# Patient Record
Sex: Female | Born: 1975 | Race: Black or African American | Hispanic: No | Marital: Married | Smoking: Never smoker
Health system: Southern US, Community
[De-identification: ages and names within clinical notes are randomized; demographics above are authoritative.]

## PROBLEM LIST (undated history)

## (undated) DIAGNOSIS — D649 Anemia, unspecified: Secondary | ICD-10-CM

## (undated) DIAGNOSIS — D563 Thalassemia minor: Secondary | ICD-10-CM

## (undated) HISTORY — PX: COLONOSCOPY: SHX174

## (undated) HISTORY — PX: OTHER SURGICAL HISTORY: SHX169

## (undated) HISTORY — DX: Thalassemia minor: D56.3

## (undated) HISTORY — PX: BREAST BIOPSY: SHX20

## (undated) HISTORY — PX: DILATION AND CURETTAGE OF UTERUS: SHX78

## (undated) HISTORY — DX: Anemia, unspecified: D64.9

---

## 2014-10-07 LAB — OB RESULTS CONSOLE GC/CHLAMYDIA
Chlamydia: NEGATIVE
Gonorrhea: NEGATIVE

## 2015-02-26 NOTE — L&D Delivery Note (Signed)
Delivery Note At 2:15 PM a viable female was delivered via Vaginal, Spontaneous Delivery   Patient complete and pushing over intact perineum. Nuchal cord x 1. Infant delivered to mom's abdomen. Delayed cord clamping x 1 minute. Cord clamped x 2, cut. Spontaneous cry heard. APGAR: 8, 9 Weight pending. Cord blood obtained. Placenta delivered spontaneously and intact. LUS cleared of clot Vagina inspected.2nd degree lacerations noted. Repaired with 4-0 Vicryl Rapide EBL: 300cc Anesthesia: epidural, local  Mom to postpartum.  Baby to Couplet care / Skin to Skin.  Christine Turner 11/07/2015, 2:37 PM

## 2015-03-29 LAB — OB RESULTS CONSOLE HIV ANTIBODY (ROUTINE TESTING): HIV: NONREACTIVE

## 2015-03-29 LAB — OB RESULTS CONSOLE ABO/RH: RH Type: POSITIVE

## 2015-03-29 LAB — OB RESULTS CONSOLE RPR: RPR: NONREACTIVE

## 2015-03-29 LAB — OB RESULTS CONSOLE HEPATITIS B SURFACE ANTIGEN: Hepatitis B Surface Ag: NEGATIVE

## 2015-03-29 LAB — OB RESULTS CONSOLE ANTIBODY SCREEN: Antibody Screen: NEGATIVE

## 2015-03-29 LAB — OB RESULTS CONSOLE RUBELLA ANTIBODY, IGM: Rubella: IMMUNE

## 2015-07-21 ENCOUNTER — Encounter: Payer: Self-pay | Admitting: *Deleted

## 2015-07-25 ENCOUNTER — Ambulatory Visit (INDEPENDENT_AMBULATORY_CARE_PROVIDER_SITE_OTHER): Payer: BLUE CROSS/BLUE SHIELD | Admitting: Family Medicine

## 2015-07-25 ENCOUNTER — Encounter: Payer: Self-pay | Admitting: Family Medicine

## 2015-07-25 VITALS — BP 103/67 | HR 101 | Ht 64.0 in | Wt 137.0 lb

## 2015-07-25 DIAGNOSIS — O0990 Supervision of high risk pregnancy, unspecified, unspecified trimester: Secondary | ICD-10-CM | POA: Insufficient documentation

## 2015-07-25 DIAGNOSIS — O34219 Maternal care for unspecified type scar from previous cesarean delivery: Secondary | ICD-10-CM

## 2015-07-25 DIAGNOSIS — Z36 Encounter for antenatal screening of mother: Secondary | ICD-10-CM

## 2015-07-25 DIAGNOSIS — O0992 Supervision of high risk pregnancy, unspecified, second trimester: Secondary | ICD-10-CM

## 2015-07-25 DIAGNOSIS — O09522 Supervision of elderly multigravida, second trimester: Secondary | ICD-10-CM

## 2015-07-25 DIAGNOSIS — L0232 Furuncle of buttock: Secondary | ICD-10-CM

## 2015-07-25 DIAGNOSIS — O09529 Supervision of elderly multigravida, unspecified trimester: Secondary | ICD-10-CM | POA: Insufficient documentation

## 2015-07-25 DIAGNOSIS — D561 Beta thalassemia: Secondary | ICD-10-CM | POA: Insufficient documentation

## 2015-07-25 DIAGNOSIS — N83202 Unspecified ovarian cyst, left side: Secondary | ICD-10-CM

## 2015-07-25 MED ORDER — SULFAMETHOXAZOLE-TRIMETHOPRIM 800-160 MG PO TABS: 1.0000 | ORAL_TABLET | Freq: Two times a day (BID) | ORAL | Status: DC

## 2015-07-25 NOTE — Patient Instructions (Signed)
Second Trimester of Pregnancy The second trimester is from week 13 through week 28, months 4 through 6. The second trimester is often a time when you feel your best. Your body has also adjusted to being pregnant, and you begin to feel better physically. Usually, morning sickness has lessened or quit completely, you may have more energy, and you may have an increase in appetite. The second trimester is also a time when the fetus is growing rapidly. At the end of the sixth month, the fetus is about 9 inches long and weighs about 1 pounds. You will likely begin to feel the baby move (quickening) between 18 and 20 weeks of the pregnancy. BODY CHANGES Your body goes through many changes during pregnancy. The changes vary from woman to woman.   Your weight will continue to increase. You will notice your lower abdomen bulging out.  You may begin to get stretch marks on your hips, abdomen, and breasts.  You may develop headaches that can be relieved by medicines approved by your health care provider.  You may urinate more often because the fetus is pressing on your bladder.  You may develop or continue to have heartburn as a result of your pregnancy.  You may develop constipation because certain hormones are causing the muscles that push waste through your intestines to slow down.  You may develop hemorrhoids or swollen, bulging veins (varicose veins).  You may have back pain because of the weight gain and pregnancy hormones relaxing your joints between the bones in your pelvis and as a result of a shift in weight and the muscles that support your balance.  Your breasts will continue to grow and be tender.  Your gums may bleed and may be sensitive to brushing and flossing.  Dark spots or blotches (chloasma, mask of pregnancy) may develop on your face. This will likely fade after the baby is born.  A dark line from your belly button to the pubic area (linea nigra) may appear. This will likely  fade after the baby is born.  You may have changes in your hair. These can include thickening of your hair, rapid growth, and changes in texture. Some women also have hair loss during or after pregnancy, or hair that feels dry or thin. Your hair will most likely return to normal after your baby is born. WHAT TO EXPECT AT YOUR PRENATAL VISITS During a routine prenatal visit:  You will be weighed to make sure you and the fetus are growing normally.  Your blood pressure will be taken.  Your abdomen will be measured to track your baby's growth.  The fetal heartbeat will be listened to.  Any test results from the previous visit will be discussed. Your health care provider may ask you:  How you are feeling.  If you are feeling the baby move.  If you have had any abnormal symptoms, such as leaking fluid, bleeding, severe headaches, or abdominal cramping.  If you are using any tobacco products, including cigarettes, chewing tobacco, and electronic cigarettes.  If you have any questions. Other tests that may be performed during your second trimester include:  Blood tests that check for:  Low iron levels (anemia).  Gestational diabetes (between 24 and 28 weeks).  Rh antibodies.  Urine tests to check for infections, diabetes, or protein in the urine.  An ultrasound to confirm the proper growth and development of the baby.  An amniocentesis to check for possible genetic problems.  Fetal screens for spina bifida   and Down syndrome.  HIV (human immunodeficiency virus) testing. Routine prenatal testing includes screening for HIV, unless you choose not to have this test. HOME CARE INSTRUCTIONS   Avoid all smoking, herbs, alcohol, and unprescribed drugs. These chemicals affect the formation and growth of the baby.  Do not use any tobacco products, including cigarettes, chewing tobacco, and electronic cigarettes. If you need help quitting, ask your health care provider. You may receive  counseling support and other resources to help you quit.  Follow your health care provider's instructions regarding medicine use. There are medicines that are either safe or unsafe to take during pregnancy.  Exercise only as directed by your health care provider. Experiencing uterine cramps is a good sign to stop exercising.  Continue to eat regular, healthy meals.  Wear a good support bra for breast tenderness.  Do not use hot tubs, steam rooms, or saunas.  Wear your seat belt at all times when driving.  Avoid raw meat, uncooked cheese, cat litter boxes, and soil used by cats. These carry germs that can cause birth defects in the baby.  Take your prenatal vitamins.  Take 1500-2000 mg of calcium daily starting at the 20th week of pregnancy until you deliver your baby.  Try taking a stool softener (if your health care provider approves) if you develop constipation. Eat more high-fiber foods, such as fresh vegetables or fruit and whole grains. Drink plenty of fluids to keep your urine clear or pale yellow.  Take warm sitz baths to soothe any pain or discomfort caused by hemorrhoids. Use hemorrhoid cream if your health care provider approves.  If you develop varicose veins, wear support hose. Elevate your feet for 15 minutes, 3-4 times a day. Limit salt in your diet.  Avoid heavy lifting, wear low heel shoes, and practice good posture.  Rest with your legs elevated if you have leg cramps or low back pain.  Visit your dentist if you have not gone yet during your pregnancy. Use a soft toothbrush to brush your teeth and be gentle when you floss.  A sexual relationship may be continued unless your health care provider directs you otherwise.  Continue to go to all your prenatal visits as directed by your health care provider. SEEK MEDICAL CARE IF:   You have dizziness.  You have mild pelvic cramps, pelvic pressure, or nagging pain in the abdominal area.  You have persistent nausea,  vomiting, or diarrhea.  You have a bad smelling vaginal discharge.  You have pain with urination. SEEK IMMEDIATE MEDICAL CARE IF:   You have a fever.  You are leaking fluid from your vagina.  You have spotting or bleeding from your vagina.  You have severe abdominal cramping or pain.  You have rapid weight gain or loss.  You have shortness of breath with chest pain.  You notice sudden or extreme swelling of your face, hands, ankles, feet, or legs.  You have not felt your baby move in over an hour.  You have severe headaches that do not go away with medicine.  You have vision changes.   This information is not intended to replace advice given to you by your health care provider. Make sure you discuss any questions you have with your health care provider.   Document Released: 02/05/2001 Document Revised: 03/04/2014 Document Reviewed: 04/14/2012 Elsevier Interactive Patient Education 2016 [employer]   Breastfeeding Deciding to breastfeed is one of the best choices you can make for you and your baby. A change   in hormones during pregnancy causes your breast tissue to grow and increases the number and size of your milk ducts. These hormones also allow proteins, sugars, and fats from your blood supply to make breast milk in your milk-producing glands. Hormones prevent breast milk from being released before your baby is born as well as prompt milk flow after birth. Once breastfeeding has begun, thoughts of your baby, as well as his or her sucking or crying, can stimulate the release of milk from your milk-producing glands.  BENEFITS OF BREASTFEEDING For Your Baby  Your first milk (colostrum) helps your baby's digestive system function better.  There are antibodies in your milk that help your baby fight off infections.  Your baby has a lower incidence of asthma, allergies, and sudden infant death syndrome.  The nutrients in breast milk are better for your baby than infant  formulas and are designed uniquely for your baby's needs.  Breast milk improves your baby's brain development.  Your baby is less likely to develop other conditions, such as childhood obesity, asthma, or type 2 diabetes mellitus. For You  Breastfeeding helps to create a very special bond between you and your baby.  Breastfeeding is convenient. Breast milk is always available at the correct temperature and costs nothing.  Breastfeeding helps to burn calories and helps you lose the weight gained during pregnancy.  Breastfeeding makes your uterus contract to its prepregnancy size faster and slows bleeding (lochia) after you give birth.   Breastfeeding helps to lower your risk of developing type 2 diabetes mellitus, osteoporosis, and breast or ovarian cancer later in life. SIGNS THAT YOUR BABY IS HUNGRY Early Signs of Hunger  Increased alertness or activity.  Stretching.  Movement of the head from side to side.  Movement of the head and opening of the mouth when the corner of the mouth or cheek is stroked (rooting).  Increased sucking sounds, smacking lips, cooing, sighing, or squeaking.  Hand-to-mouth movements.  Increased sucking of fingers or hands. Late Signs of Hunger  Fussing.  Intermittent crying. Extreme Signs of Hunger Signs of extreme hunger will require calming and consoling before your baby will be able to breastfeed successfully. Do not wait for the following signs of extreme hunger to occur before you initiate breastfeeding:  Restlessness.  A loud, strong cry.  Screaming. BREASTFEEDING BASICS Breastfeeding Initiation  Find a comfortable place to sit or lie down, with your neck and back well supported.  Place a pillow or rolled up blanket under your baby to bring him or her to the level of your breast (if you are seated). Nursing pillows are specially designed to help support your arms and your baby while you breastfeed.  Make sure that your baby's  abdomen is facing your abdomen.  Gently massage your breast. With your fingertips, massage from your chest wall toward your nipple in a circular motion. This encourages milk flow. You may need to continue this action during the feeding if your milk flows slowly.  Support your breast with 4 fingers underneath and your thumb above your nipple. Make sure your fingers are well away from your nipple and your baby's mouth.  Stroke your baby's lips gently with your finger or nipple.  When your baby's mouth is open wide enough, quickly bring your baby to your breast, placing your entire nipple and as much of the colored area around your nipple (areola) as possible into your baby's mouth.  More areola should be visible above your baby's upper lip than   below the lower lip.  Your baby's tongue should be between his or her lower gum and your breast.  Ensure that your baby's mouth is correctly positioned around your nipple (latched). Your baby's lips should create a seal on your breast and be turned out (everted).  It is common for your baby to suck about 2-3 minutes in order to start the flow of breast milk. Latching Teaching your baby how to latch on to your breast properly is very important. An improper latch can cause nipple pain and decreased milk supply for you and poor weight gain in your baby. Also, if your baby is not latched onto your nipple properly, he or she may swallow some air during feeding. This can make your baby fussy. Burping your baby when you switch breasts during the feeding can help to get rid of the air. However, teaching your baby to latch on properly is still the best way to prevent fussiness from swallowing air while breastfeeding. Signs that your baby has successfully latched on to your nipple:  Silent tugging or silent sucking, without causing you pain.  Swallowing heard between every 3-4 sucks.  Muscle movement above and in front of his or her ears while sucking. Signs  that your baby has not successfully latched on to nipple:  Sucking sounds or smacking sounds from your baby while breastfeeding.  Nipple pain. If you think your baby has not latched on correctly, slip your finger into the corner of your baby's mouth to break the suction and place it between your baby's gums. Attempt breastfeeding initiation again. Signs of Successful Breastfeeding Signs from your baby:  A gradual decrease in the number of sucks or complete cessation of sucking.  Falling asleep.  Relaxation of his or her body.  Retention of a small amount of milk in his or her mouth.  Letting go of your breast by himself or herself. Signs from you:  Breasts that have increased in firmness, weight, and size 1-3 hours after feeding.  Breasts that are softer immediately after breastfeeding.  Increased milk volume, as well as a change in milk consistency and color by the fifth day of breastfeeding.  Nipples that are not sore, cracked, or bleeding. Signs That Your Baby is Getting Enough Milk  Wetting at least 3 diapers in a 24-hour period. The urine should be clear and pale yellow by age 5 days.  At least 3 stools in a 24-hour period by age 5 days. The stool should be soft and yellow.  At least 3 stools in a 24-hour period by age 7 days. The stool should be seedy and yellow.  No loss of weight greater than 10% of birth weight during the first 3 days of age.  Average weight gain of 4-7 ounces (113-198 g) per week after age 4 days.  Consistent daily weight gain by age 5 days, without weight loss after the age of 2 weeks. After a feeding, your baby may spit up a small amount. This is common. BREASTFEEDING FREQUENCY AND DURATION Frequent feeding will help you make more milk and can prevent sore nipples and breast engorgement. Breastfeed when you feel the need to reduce the fullness of your breasts or when your baby shows signs of hunger. This is called "breastfeeding on demand." Avoid  introducing a pacifier to your baby while you are working to establish breastfeeding (the first 4-6 weeks after your baby is born). After this time you may choose to use a pacifier. Research has shown that   pacifier use during the first year of a baby's life decreases the risk of sudden infant death syndrome (SIDS). Allow your baby to feed on each breast as long as he or she wants. Breastfeed until your baby is finished feeding. When your baby unlatches or falls asleep while feeding from the first breast, offer the second breast. Because newborns are often sleepy in the first few weeks of life, you may need to awaken your baby to get him or her to feed. Breastfeeding times will vary from baby to baby. However, the following rules can serve as a guide to help you ensure that your baby is properly fed:  Newborns (babies 4 weeks of age or younger) may breastfeed every 1-3 hours.  Newborns should not go longer than 3 hours during the day or 5 hours during the night without breastfeeding.  You should breastfeed your baby a minimum of 8 times in a 24-hour period until you begin to introduce solid foods to your baby at around 6 months of age. BREAST MILK PUMPING Pumping and storing breast milk allows you to ensure that your baby is exclusively fed your breast milk, even at times when you are unable to breastfeed. This is especially important if you are going back to work while you are still breastfeeding or when you are not able to be present during feedings. Your lactation consultant can give you guidelines on how long it is safe to store breast milk. A breast pump is a machine that allows you to pump milk from your breast into a sterile bottle. The pumped breast milk can then be stored in a refrigerator or freezer. Some breast pumps are operated by hand, while others use electricity. Ask your lactation consultant which type will work best for you. Breast pumps can be purchased, but some hospitals and  breastfeeding support groups lease breast pumps on a monthly basis. A lactation consultant can teach you how to hand express breast milk, if you prefer not to use a pump. CARING FOR YOUR BREASTS WHILE YOU BREASTFEED Nipples can become dry, cracked, and sore while breastfeeding. The following recommendations can help keep your breasts moisturized and healthy:  Avoid using soap on your nipples.  Wear a supportive bra. Although not required, special nursing bras and tank tops are designed to allow access to your breasts for breastfeeding without taking off your entire bra or top. Avoid wearing underwire-style bras or extremely tight bras.  Air dry your nipples for 3-4minutes after each feeding.  Use only cotton bra pads to absorb leaked breast milk. Leaking of breast milk between feedings is normal.  Use lanolin on your nipples after breastfeeding. Lanolin helps to maintain your skin's normal moisture barrier. If you use pure lanolin, you do not need to wash it off before feeding your baby again. Pure lanolin is not toxic to your baby. You may also hand express a few drops of breast milk and gently massage that milk into your nipples and allow the milk to air dry. In the first few weeks after giving birth, some women experience extremely full breasts (engorgement). Engorgement can make your breasts feel heavy, warm, and tender to the touch. Engorgement peaks within 3-5 days after you give birth. The following recommendations can help ease engorgement:  Completely empty your breasts while breastfeeding or pumping. You may want to start by applying warm, moist heat (in the shower or with warm water-soaked hand towels) just before feeding or pumping. This increases circulation and helps the milk   flow. If your baby does not completely empty your breasts while breastfeeding, pump any extra milk after he or she is finished.  Wear a snug bra (nursing or regular) or tank top for 1-2 days to signal your body  to slightly decrease milk production.  Apply ice packs to your breasts, unless this is too uncomfortable for you.  Make sure that your baby is latched on and positioned properly while breastfeeding. If engorgement persists after 48 hours of following these recommendations, contact your health care provider or a lactation consultant. OVERALL HEALTH CARE RECOMMENDATIONS WHILE BREASTFEEDING  Eat healthy foods. Alternate between meals and snacks, eating 3 of each per day. Because what you eat affects your breast milk, some of the foods may make your baby more irritable than usual. Avoid eating these foods if you are sure that they are negatively affecting your baby.  Drink milk, fruit juice, and water to satisfy your thirst (about 10 glasses a day).  Rest often, relax, and continue to take your prenatal vitamins to prevent fatigue, stress, and anemia.  Continue breast self-awareness checks.  Avoid chewing and smoking tobacco. Chemicals from cigarettes that pass into breast milk and exposure to secondhand smoke may harm your baby.  Avoid alcohol and drug use, including marijuana. Some medicines that may be harmful to your baby can pass through breast milk. It is important to ask your health care provider before taking any medicine, including all over-the-counter and prescription medicine as well as vitamin and herbal supplements. It is possible to become pregnant while breastfeeding. If birth control is desired, ask your health care provider about options that will be safe for your baby. SEEK MEDICAL CARE IF:  You feel like you want to stop breastfeeding or have become frustrated with breastfeeding.  You have painful breasts or nipples.  Your nipples are cracked or bleeding.  Your breasts are red, tender, or warm.  You have a swollen area on either breast.  You have a fever or chills.  You have nausea or vomiting.  You have drainage other than breast milk from your nipples.  Your  breasts do not become full before feedings by the fifth day after you give birth.  You feel sad and depressed.  Your baby is too sleepy to eat well.  Your baby is having trouble sleeping.   Your baby is wetting less than 3 diapers in a 24-hour period.  Your baby has less than 3 stools in a 24-hour period.  Your baby's skin or the white part of his or her eyes becomes yellow.   Your baby is not gaining weight by 5 days of age. SEEK IMMEDIATE MEDICAL CARE IF:  Your baby is overly tired (lethargic) and does not want to wake up and feed.  Your baby develops an unexplained fever.   This information is not intended to replace advice given to you by your health care provider. Make sure you discuss any questions you have with your health care provider.   Document Released: 02/11/2005 Document Revised: 11/02/2014 Document Reviewed: 08/05/2012 Elsevier Interactive Patient Education 2016 [employer] Inc[employer]  

## 2015-07-25 NOTE — Progress Notes (Signed)
Subjective:  Christine Turner is a 40 y.o. [id number] at 462w0d being seen today for transferring prenatal care.  [facility] [place] [facility] [place] [facility]. Family is here. She is currently monitored for the following issues for this high-risk pregnancy and has Supervision of high risk pregnancy, antepartum; AMA (advanced maternal age) multigravida 35+; Previous cesarean delivery affecting pregnancy, antepartum; Beta thalassemia (HCC); and Left ovarian cyst - simple 2.5 cm on her problem list.  Patient reports no complaints.  Contractions: Not present. Vag. Bleeding: None.  Movement: Present. Denies leaking of fluid.   The following portions of the patient's history were reviewed and updated as appropriate: allergies, current medications, past family history, past medical history, past social history, past surgical history and problem list. Problem list updated.  Objective:   Filed Vitals:   07/25/15 1342 07/25/15 1344  BP: 103/67   Pulse: 101   Height:  5\' 4"  (1.626 m)  Weight: 137 lb (62.143 kg)     Fetal Status: Fetal Heart Rate (bpm): 152 Fundal Height: 26 cm Movement: Present     General:  Alert, oriented and cooperative. Patient is in no acute distress.  Skin: Skin is warm and dry. No rash noted.   Cardiovascular: Normal heart rate noted  Respiratory: Normal respiratory effort, no problems with respiration noted  Abdomen: Soft, gravid, appropriate for gestational age. Pain/Pressure: Present     Pelvic: Vag. Bleeding: None Vag D/C Character: Thin   Cervical exam deferred        Extremities: Normal range of motion.  Edema: None  Mental Status: Normal mood and affect. Normal behavior. Normal judgment and thought content.   Urinalysis: Urine Protein: Trace Urine Glucose: Negative  Assessment and Plan:  Pregnancy: [id number] at 1462w0d  1. Supervision of high risk pregnancy, antepartum, second trimester Continue prenatal care. 28 wk labs and TDaP this week   2. AMA (advanced maternal  age) multigravida 35+, second trimester Schedule u/s for growth due to AMA - US MFM OB COMP + 14 WK; Future  3. Boil of buttock Probable inclusion cyst, mild erythema--will give Abx. - sulfamethoxazole-trimethoprim (BACTRIM DS,SEPTRA DS) 800-160 MG tablet; Take 1 tablet by mouth 2 (two) times daily.  Dispense: 14 tablet; Refill: 0  4. Previous cesarean delivery affecting pregnancy, antepartum Strongly desires TOLAC.--counseled.  5. Beta thalassemia (HCC) Mild anemia  6. Left ovarian cyst - simple 2.5 cm F/u at next us  Preterm labor symptoms and general obstetric precautions including but not limited to vaginal bleeding, contractions, leaking of fluid and fetal movement were reviewed in detail with the patient. Please refer to After Visit Summary for other counseling recommendations.  Return in 3 weeks (on 08/15/2015).   Reva Boresanya S Pratt, MD

## 2015-07-28 ENCOUNTER — Encounter: Payer: Self-pay | Admitting: *Deleted

## 2015-07-28 ENCOUNTER — Encounter (INDEPENDENT_AMBULATORY_CARE_PROVIDER_SITE_OTHER): Payer: Self-pay

## 2015-07-28 ENCOUNTER — Ambulatory Visit (INDEPENDENT_AMBULATORY_CARE_PROVIDER_SITE_OTHER): Payer: BLUE CROSS/BLUE SHIELD | Admitting: Obstetrics and Gynecology

## 2015-07-28 VITALS — BP 97/61 | HR 98 | Wt 137.0 lb

## 2015-07-28 DIAGNOSIS — O0992 Supervision of high risk pregnancy, unspecified, second trimester: Secondary | ICD-10-CM

## 2015-07-28 DIAGNOSIS — Z36 Encounter for antenatal screening of mother: Secondary | ICD-10-CM | POA: Diagnosis not present

## 2015-07-28 LAB — CBC
HCT: 29.5 % — ABNORMAL LOW (ref 35.0–45.0)
Hemoglobin: 9.3 g/dL — ABNORMAL LOW (ref 11.7–15.5)
MCH: 23.8 pg — ABNORMAL LOW (ref 27.0–33.0)
MCHC: 31.5 g/dL — ABNORMAL LOW (ref 32.0–36.0)
MCV: 75.4 fL — ABNORMAL LOW (ref 80.0–100.0)
Platelets: 190 10*3/uL (ref 140–400)
RBC: 3.91 MIL/uL (ref 3.80–5.10)
RDW: 24.8 % — ABNORMAL HIGH (ref 11.0–15.0)
WBC: 10.7 10*3/uL (ref 3.8–10.8)

## 2015-07-28 NOTE — Progress Notes (Signed)
Subjective:  Christine Turner is a 40 y.o. [id number] at 4127w3d being seen today for ongoing prenatal care.  She is currently monitored for the following issues for this high-risk pregnancy and has Supervision of high risk pregnancy, antepartum; AMA (advanced maternal age) multigravida 35+; Previous cesarean delivery affecting pregnancy, antepartum; Beta thalassemia (HCC); and Left ovarian cyst - simple 2.5 cm on her problem list.  Patient reports persistent discomfort in left outter upper buttock boil. She is still taking the antibiotcs.  Contractions: Not present. Vag. Bleeding: None.  Movement: Present. Denies leaking of fluid.   The following portions of the patient's history were reviewed and updated as appropriate: allergies, current medications, past family history, past medical history, past social history, past surgical history and problem list. Problem list updated.  Objective:   Filed Vitals:   07/28/15 1013  BP: 97/61  Pulse: 98  Weight: 137 lb (62.143 kg)    Fetal Status: Fetal Heart Rate (bpm): 151   Movement: Present     General:  Alert, oriented and cooperative. Patient is in no acute distress.  Skin: Skin is warm and dry. No rash noted.   Cardiovascular: Normal heart rate noted  Respiratory: Normal respiratory effort, no problems with respiration noted  Abdomen: Soft, gravid, appropriate for gestational age. Pain/Pressure: Absent     Pelvic: Vag. Bleeding: None Vag D/C Character: Thin   Cervical exam deferred        Buttock: 5 cm area of erythema in left upper outer aspect of left buttock. No evidence of induration. Warm and tender to touch. No area of fluctuance  Extremities: Normal range of motion.  Edema: None  Mental Status: Normal mood and affect. Normal behavior. Normal judgment and thought content.   Urinalysis:      Assessment and Plan:  Pregnancy: [id number] at 6727w3d  1. Supervision of high risk pregnancy, antepartum, second trimester Patient is doing well  Third  trimester labs this week as [facility] and will return in 4 weeks - Glucose Tolerance, 1 HR (50g) - CBC - RPR - HIV antibody  2. AMA, second trimester - Follow up growth ultrasound already scheduled  3. Boil of buttock - not ready to be drained - continue antibiotics - apply warm compresses to the area to facilitate spontaneous drainage or help the abscess come to a head - Follow up with PCP next week to see if ready for I&D  Preterm labor symptoms and general obstetric precautions including but not limited to vaginal bleeding, contractions, leaking of fluid and fetal movement were reviewed in detail with the patient. Please refer to After Visit Summary for other counseling recommendations.  No Follow-up on file.   Catalina AntiguaPeggy Ketura Sirek, MD

## 2015-07-29 LAB — RPR

## 2015-07-29 LAB — HIV ANTIBODY (ROUTINE TESTING W REFLEX): HIV 1&2 Ab, 4th Generation: NONREACTIVE

## 2015-07-31 LAB — GLUCOSE TOLERANCE, 1 HOUR (50G) W/O FASTING: Glucose, 1 Hr, gestational: 83 mg/dL (ref <140)

## 2015-08-22 ENCOUNTER — Other Ambulatory Visit: Payer: Self-pay | Admitting: Family Medicine

## 2015-08-22 ENCOUNTER — Ambulatory Visit (HOSPITAL_COMMUNITY)
Admission: RE | Admit: 2015-08-22 | Discharge: 2015-08-22 | Disposition: A | Discharge status: Home / Self Care | Payer: BLUE CROSS/BLUE SHIELD | Source: Ambulatory Visit | Attending: Family Medicine | Admitting: Family Medicine

## 2015-08-22 DIAGNOSIS — Z3A29 29 weeks gestation of pregnancy: Secondary | ICD-10-CM | POA: Diagnosis not present

## 2015-08-22 DIAGNOSIS — Z148 Genetic carrier of other disease: Secondary | ICD-10-CM | POA: Insufficient documentation

## 2015-08-22 DIAGNOSIS — O09523 Supervision of elderly multigravida, third trimester: Secondary | ICD-10-CM | POA: Insufficient documentation

## 2015-08-22 DIAGNOSIS — Z36 Encounter for antenatal screening of mother: Secondary | ICD-10-CM | POA: Diagnosis not present

## 2015-08-22 DIAGNOSIS — O09522 Supervision of elderly multigravida, second trimester: Secondary | ICD-10-CM

## 2015-08-24 ENCOUNTER — Ambulatory Visit (INDEPENDENT_AMBULATORY_CARE_PROVIDER_SITE_OTHER): Payer: BLUE CROSS/BLUE SHIELD | Admitting: Advanced Practice Midwife

## 2015-08-24 VITALS — BP 101/59 | HR 99 | Wt 140.0 lb

## 2015-08-24 DIAGNOSIS — O0992 Supervision of high risk pregnancy, unspecified, second trimester: Secondary | ICD-10-CM

## 2015-08-24 DIAGNOSIS — O34219 Maternal care for unspecified type scar from previous cesarean delivery: Secondary | ICD-10-CM

## 2015-08-24 DIAGNOSIS — O09523 Supervision of elderly multigravida, third trimester: Secondary | ICD-10-CM

## 2015-08-24 DIAGNOSIS — Z36 Encounter for antenatal screening of mother: Secondary | ICD-10-CM

## 2015-08-24 DIAGNOSIS — Z1389 Encounter for screening for other disorder: Secondary | ICD-10-CM

## 2015-08-24 DIAGNOSIS — Z3A34 34 weeks gestation of pregnancy: Secondary | ICD-10-CM

## 2015-08-24 DIAGNOSIS — Z23 Encounter for immunization: Secondary | ICD-10-CM

## 2015-08-24 NOTE — Progress Notes (Signed)
Subjective:  Christine Turner is a 40 y.o. [id number] at 5653w2d being seen today for ongoing prenatal care.  She is currently monitored for the following issues for this high-risk pregnancy and has Supervision of high risk pregnancy, antepartum; AMA (advanced maternal age) multigravida 35+; Previous cesarean delivery affecting pregnancy, antepartum; Beta thalassemia (HCC); and Left ovarian cyst - simple 2.5 cm on her problem list.  Patient reports fatigue.  Contractions: Not present. Vag. Bleeding: None.  Movement: Present. Denies leaking of fluid.   The following portions of the patient's history were reviewed and updated as appropriate: allergies, current medications, past family history, past medical history, past social history, past surgical history and problem list. Problem list updated.  Objective:   Filed Vitals:   08/24/15 1409  BP: 101/59  Pulse: 99  Weight: 140 lb (63.504 kg)    Fetal Status: Fetal Heart Rate (bpm): 140 Fundal Height: 29 cm Movement: Present     General:  Alert, oriented and cooperative. Patient is in no acute distress.  Skin: Skin is warm and dry. No rash noted.   Cardiovascular: Normal heart rate noted  Respiratory: Normal respiratory effort, no problems with respiration noted  Abdomen: Soft, gravid, appropriate for gestational age. Pain/Pressure: Present     Pelvic: Cervical exam deferred        Extremities: Normal range of motion.  Edema: None  Mental Status: Normal mood and affect. Normal behavior. Normal judgment and thought content.   Urinalysis:    Neg/Neg  Assessment and Plan:  Pregnancy: [id number] at 2253w2d  1. Supervision of high risk pregnancy, antepartum, second trimester  - Tdap vaccine greater than or equal to 7yo IM - US MFM OB LIMITED; Future  2. AMA (advanced maternal age) multigravida 35+, third trimester  - US MFM OB LIMITED; Future  3. [redacted] weeks gestation of pregnancy  - US MFM OB LIMITED; Future  4. Encounter for routine screening  for malformation using ultrasonics  - US MFM OB LIMITED; Future  5. Previous cesarean delivery affecting pregnancy, antepartum   Preterm labor symptoms and general obstetric precautions including but not limited to vaginal bleeding, contractions, leaking of fluid and fetal movement were reviewed in detail with the patient. Please refer to After Visit Summary for other counseling recommendations.  Return in about 2 weeks (around 09/07/2015).   Dorathy KinsmanVirginia Kholton Coate, CNM

## 2015-08-24 NOTE — Patient Instructions (Addendum)
Tdap Vaccine (Tetanus, Diphtheria and Pertussis): What You Need to Know 1. Why get vaccinated? Tetanus, diphtheria and pertussis are very serious diseases. Tdap vaccine can protect us from these diseases. And, Tdap vaccine given to pregnant women can protect newborn babies against pertussis. TETANUS (Lockjaw) is rare in the [place] today. It causes painful muscle tightening and stiffness, usually all over the body.  It can lead to tightening of muscles in the head and neck so you can't open your mouth, swallow, or sometimes even breathe. Tetanus kills about 1 out of 10 people who are infected even after receiving the best medical care. DIPHTHERIA is also rare in the [place] today. It can cause a thick coating to form in the back of the throat.  It can lead to breathing problems, heart failure, paralysis, and death. PERTUSSIS (Whooping Cough) causes severe coughing spells, which can cause difficulty breathing, vomiting and disturbed sleep.  It can also lead to weight loss, incontinence, and rib fractures. Up to 2 in 100 adolescents and 5 in 100 adults with pertussis are hospitalized or have complications, which could include pneumonia or death. These diseases are caused by bacteria. Diphtheria and pertussis are spread from person to person through secretions from coughing or sneezing. Tetanus enters the body through cuts, scratches, or wounds. Before vaccines, as many as 200,000 cases of diphtheria, 200,000 cases of pertussis, and hundreds of cases of tetanus, were reported in the [place] each year. Since vaccination began, reports of cases for tetanus and diphtheria have dropped by about 99% and for pertussis by about 80%. 2. Tdap vaccine Tdap vaccine can protect adolescents and adults from tetanus, diphtheria, and pertussis. One dose of Tdap is routinely given at age 11 or 12. People who did not get Tdap at that age should get it as soon as possible. Tdap is especially important  for healthcare professionals and anyone having close contact with a baby younger than 12 months. Pregnant women should get a dose of Tdap during every pregnancy, to protect the newborn from pertussis. Infants are most at risk for severe, life-threatening complications from pertussis. Another vaccine, called Td, protects against tetanus and diphtheria, but not pertussis. A Td booster should be given every 10 years. Tdap may be given as one of these boosters if you have never gotten Tdap before. Tdap may also be given after a severe cut or burn to prevent tetanus infection. Your doctor or the person giving you the vaccine can give you more information. Tdap may safely be given at the same time as other vaccines. 3. Some people should not get this vaccine  A person who has ever had a life-threatening allergic reaction after a previous dose of any diphtheria, tetanus or pertussis containing vaccine, OR has a severe allergy to any part of this vaccine, should not get Tdap vaccine. Tell the person giving the vaccine about any severe allergies.  Anyone who had coma or long repeated seizures within 7 days after a childhood dose of DTP or DTaP, or a previous dose of Tdap, should not get Tdap, unless a cause other than the vaccine was found. They can still get Td.  Talk to your doctor if you:  have seizures or another nervous system problem,  had severe pain or swelling after any vaccine containing diphtheria, tetanus or pertussis,  ever had a condition called Guillain-Barr Syndrome (GBS),  aren't feeling well on the day the shot is scheduled. 4. Risks With any medicine, including vaccines, there is   a chance of side effects. These are usually mild and go away on their own. Serious reactions are also possible but are rare. Most people who get Tdap vaccine do not have any problems with it. Mild problems following Tdap (Did not interfere with activities)  Pain where the shot was given (about 3 in 4  adolescents or 2 in 3 adults)  Redness or swelling where the shot was given (about 1 person in 5)  Mild fever of at least 100.4F (up to about 1 in 25 adolescents or 1 in 100 adults)  Headache (about 3 or 4 people in 10)  Tiredness (about 1 person in 3 or 4)  Nausea, vomiting, diarrhea, stomach ache (up to 1 in 4 adolescents or 1 in 10 adults)  Chills, sore joints (about 1 person in 10)  Body aches (about 1 person in 3 or 4)  Rash, swollen glands (uncommon) Moderate problems following Tdap (Interfered with activities, but did not require medical attention)  Pain where the shot was given (up to 1 in 5 or 6)  Redness or swelling where the shot was given (up to about 1 in 16 adolescents or 1 in 12 adults)  Fever over 102F (about 1 in 100 adolescents or 1 in 250 adults)  Headache (about 1 in 7 adolescents or 1 in 10 adults)  Nausea, vomiting, diarrhea, stomach ache (up to 1 or 3 people in 100)  Swelling of the entire arm where the shot was given (up to about 1 in 500). Severe problems following Tdap (Unable to perform usual activities; required medical attention)  Swelling, severe pain, bleeding and redness in the arm where the shot was given (rare). Problems that could happen after any vaccine:  People sometimes faint after a medical procedure, including vaccination. Sitting or lying down for about 15 minutes can help prevent fainting, and injuries caused by a fall. Tell your doctor if you feel dizzy, or have vision changes or ringing in the ears.  Some people get severe pain in the shoulder and have difficulty moving the arm where a shot was given. This happens very rarely.  Any medication can cause a severe allergic reaction. Such reactions from a vaccine are very rare, estimated at fewer than 1 in a million doses, and would happen within a few minutes to a few hours after the vaccination. As with any medicine, there is a very remote chance of a vaccine causing a serious  injury or death. The safety of vaccines is always being monitored. For more information, visit: www.cdc.gov/vaccinesafety/ 5. What if there is a serious problem? What should I look for?  Look for anything that concerns you, such as signs of a severe allergic reaction, very high fever, or unusual behavior.  Signs of a severe allergic reaction can include hives, swelling of the face and throat, difficulty breathing, a fast heartbeat, dizziness, and weakness. These would usually start a few minutes to a few hours after the vaccination. What should I do?  If you think it is a severe allergic reaction or other emergency that can't wait, call 9-1-1 or get the person to the nearest hospital. Otherwise, call your doctor.  Afterward, the reaction should be reported to the Vaccine Adverse Event Reporting System (VAERS). Your doctor might file this report, or you can do it yourself through the VAERS web site at www.vaers.hhs.gov, or by calling 1-800-822-7967. VAERS does not give medical advice.  6. The National Vaccine Injury Compensation Program The National Vaccine Injury Compensation Program (  VICP) is a federal program that was created to compensate people who may have been injured by certain vaccines. Persons who believe they may have been injured by a vaccine can learn about the program and about filing a claim by calling 1-(979) 286-7238 or visiting the VICP website at SpiritualWord.atwww.hrsa.gov/vaccinecompensation. There is a time limit to file a claim for compensation. 7. How can I learn more?  Ask your doctor. He or she can give you the vaccine package insert or suggest other sources of information.  Call your local or state health department.  Contact the Centers for Disease Control and Prevention (CDC):  Call (873) 882-92531-928 843 9052 (1-800-CDC-INFO) or  Visit CDC's website at PicCapture.uywww.cdc.gov/vaccines CDC Tdap Vaccine VIS (04/20/13)   This information is not intended to replace advice given to you by your health care  provider. Make sure you discuss any questions you have with your health care provider.   Document Released: 08/13/2011 Document Revised: 03/04/2014 Document Reviewed: 05/26/2013 Elsevier Interactive Patient Education 2016 [employer]   Intrauterine Device Information An intrauterine device (IUD) is inserted into your uterus to prevent pregnancy. There are two types of IUDs available:   Copper IUD--This type of IUD is wrapped in copper wire and is placed inside the uterus. Copper makes the uterus and fallopian tubes produce a fluid that kills sperm. The copper IUD can stay in place for 10 years.  Hormone IUD--This type of IUD contains the hormone progestin (synthetic progesterone). The hormone thickens the cervical mucus and prevents sperm from entering the uterus. It also thins the uterine lining to prevent implantation of a fertilized egg. The hormone can weaken or kill the sperm that get into the uterus. One type of hormone IUD can stay in place for 5 years, and another type can stay in place for 3 years. Your health care provider will make sure you are a good candidate for a contraceptive IUD. Discuss with your health care provider the possible side effects.  ADVANTAGES OF AN INTRAUTERINE DEVICE  IUDs are highly effective, reversible, long acting, and low maintenance.   There are no estrogen-related side effects.   An IUD can be used when breastfeeding.   IUDs are not associated with weight gain.   The copper IUD works immediately after insertion.   The hormone IUD works right away if inserted within 7 days of your period starting. You will need to use a backup method of birth control for 7 days if the hormone IUD is inserted at any other time in your cycle.  The copper IUD does not interfere with your female hormones.   The hormone IUD can make heavy menstrual periods lighter and decrease cramping.   The hormone IUD can be used for 3 or 5 years.   The copper IUD can be  used for 10 years. DISADVANTAGES OF AN INTRAUTERINE DEVICE  The hormone IUD can be associated with irregular bleeding patterns.   The copper IUD can make your menstrual flow heavier and more painful.   You may experience cramping and vaginal bleeding after insertion.    This information is not intended to replace advice given to you by your health care provider. Make sure you discuss any questions you have with your health care provider.   Document Released: 01/16/2004 Document Revised: 10/14/2012 Document Reviewed: 08/02/2012 Elsevier Interactive Patient Education [employer]2016 Elsevier Inc.

## 2015-08-28 ENCOUNTER — Other Ambulatory Visit (HOSPITAL_COMMUNITY): Payer: BLUE CROSS/BLUE SHIELD

## 2015-08-28 ENCOUNTER — Encounter: Payer: BLUE CROSS/BLUE SHIELD | Admitting: Obstetrics and Gynecology

## 2015-08-31 ENCOUNTER — Encounter: Payer: Self-pay | Admitting: *Deleted

## 2015-10-03 ENCOUNTER — Encounter: Payer: PRIVATE HEALTH INSURANCE | Admitting: Family Medicine

## 2015-10-04 ENCOUNTER — Ambulatory Visit (HOSPITAL_COMMUNITY): Payer: PRIVATE HEALTH INSURANCE

## 2015-10-09 ENCOUNTER — Ambulatory Visit (INDEPENDENT_AMBULATORY_CARE_PROVIDER_SITE_OTHER): Payer: PRIVATE HEALTH INSURANCE | Admitting: Obstetrics and Gynecology

## 2015-10-09 VITALS — BP 109/68 | HR 91 | Wt 149.0 lb

## 2015-10-09 DIAGNOSIS — Z113 Encounter for screening for infections with a predominantly sexual mode of transmission: Secondary | ICD-10-CM

## 2015-10-09 DIAGNOSIS — O34219 Maternal care for unspecified type scar from previous cesarean delivery: Secondary | ICD-10-CM

## 2015-10-09 DIAGNOSIS — O09523 Supervision of elderly multigravida, third trimester: Secondary | ICD-10-CM

## 2015-10-09 DIAGNOSIS — O0993 Supervision of high risk pregnancy, unspecified, third trimester: Secondary | ICD-10-CM

## 2015-10-09 LAB — OB RESULTS CONSOLE GBS: GBS: NEGATIVE

## 2015-10-09 LAB — OB RESULTS CONSOLE GC/CHLAMYDIA: Gonorrhea: NEGATIVE

## 2015-10-09 NOTE — Progress Notes (Signed)
Subjective:  Christine Turner is a 40 y.o. [id number] at 3657w6d being seen today for ongoing prenatal care.  She is currently monitored for the following issues for this high-risk pregnancy and has Supervision of high risk pregnancy, antepartum; AMA (advanced maternal age) multigravida 35+; Previous cesarean delivery affecting pregnancy, antepartum; Beta thalassemia (HCC); and Left ovarian cyst - simple 2.5 cm on her problem list.  Patient reports no complaints.  Contractions: Irritability. Vag. Bleeding: None.  Movement: Present. Denies leaking of fluid.   The following portions of the patient's history were reviewed and updated as appropriate: allergies, current medications, past family history, past medical history, past social history, past surgical history and problem list. Problem list updated.  Objective:   Vitals:   10/09/15 1441  BP: 109/68  Pulse: 91  Weight: 149 lb (67.6 kg)    Fetal Status: Fetal Heart Rate (bpm): 145 Fundal Height: 36 cm Movement: Present  Presentation: Vertex  General:  Alert, oriented and cooperative. Patient is in no acute distress.  Skin: Skin is warm and dry. No rash noted.   Cardiovascular: Normal heart rate noted  Respiratory: Normal respiratory effort, no problems with respiration noted  Abdomen: Soft, gravid, appropriate for gestational age. Pain/Pressure: Present     Pelvic:  Cervical exam performed Dilation: 1 Effacement (%): Thick Station: Ballotable  Extremities: Normal range of motion.  Edema: None  Mental Status: Normal mood and affect. Normal behavior. Normal judgment and thought content.   Urinalysis: Urine Protein: Negative Urine Glucose: Negative  Assessment and Plan:  Pregnancy: [id number] at 4157w6d  1. Supervision of high risk pregnancy, antepartum, third trimester Patient is doing well Cultures collected - [facility] - Culture, Grp B Strep w/Rflx Suscept  2. Previous cesarean delivery affecting  pregnancy, antepartum Patient desires TOLAC  3. AMA (advanced maternal age) multigravida 35+, third trimester   Preterm labor symptoms and general obstetric precautions including but not limited to vaginal bleeding, contractions, leaking of fluid and fetal movement were reviewed in detail with the patient. Please refer to After Visit Summary for other counseling recommendations.  Return in about 1 week (around 10/16/2015).   Catalina AntiguaPeggy Bright Spielmann, MD

## 2015-10-10 ENCOUNTER — Ambulatory Visit (HOSPITAL_COMMUNITY)
Admission: RE | Admit: 2015-10-10 | Discharge: 2015-10-10 | Disposition: A | Discharge status: Home / Self Care | Payer: PRIVATE HEALTH INSURANCE | Source: Ambulatory Visit | Attending: Advanced Practice Midwife | Admitting: Advanced Practice Midwife

## 2015-10-10 ENCOUNTER — Encounter (HOSPITAL_COMMUNITY): Payer: Self-pay

## 2015-10-10 ENCOUNTER — Other Ambulatory Visit: Payer: Self-pay | Admitting: Advanced Practice Midwife

## 2015-10-10 DIAGNOSIS — Z3A36 36 weeks gestation of pregnancy: Secondary | ICD-10-CM | POA: Insufficient documentation

## 2015-10-10 DIAGNOSIS — O09523 Supervision of elderly multigravida, third trimester: Secondary | ICD-10-CM | POA: Diagnosis present

## 2015-10-10 DIAGNOSIS — O34219 Maternal care for unspecified type scar from previous cesarean delivery: Secondary | ICD-10-CM

## 2015-10-10 DIAGNOSIS — O0992 Supervision of high risk pregnancy, unspecified, second trimester: Secondary | ICD-10-CM

## 2015-10-10 LAB — GC/CHLAMYDIA PROBE AMP (~~LOC~~) NOT AT ARMC
Chlamydia: NEGATIVE
Neisseria Gonorrhea: NEGATIVE

## 2015-10-11 ENCOUNTER — Telehealth: Payer: Self-pay | Admitting: *Deleted

## 2015-10-11 ENCOUNTER — Encounter: Payer: Self-pay | Admitting: *Deleted

## 2015-10-11 DIAGNOSIS — Z789 Other specified health status: Secondary | ICD-10-CM

## 2015-10-11 MED ORDER — BREAST PUMP MISC: 0 refills | Status: DC

## 2015-10-11 NOTE — Telephone Encounter (Signed)
-----   Message from Jacinda S BatOlevia Bowenstle sent at 10/11/2015  2:24 PM EDT ----- Regarding: Rx Request Needs Rx for Breast Pump Fax to [phone]

## 2015-10-11 NOTE — Telephone Encounter (Signed)
Breast pump order faxed to number provided per pt request.

## 2015-10-12 LAB — CULTURE, STREPTOCOCCUS GRP B W/SUSCEPT

## 2015-10-16 ENCOUNTER — Ambulatory Visit (HOSPITAL_COMMUNITY): Payer: PRIVATE HEALTH INSURANCE

## 2015-10-16 ENCOUNTER — Encounter: Payer: Self-pay | Admitting: *Deleted

## 2015-10-18 ENCOUNTER — Ambulatory Visit (INDEPENDENT_AMBULATORY_CARE_PROVIDER_SITE_OTHER): Payer: BLUE CROSS/BLUE SHIELD | Admitting: Family Medicine

## 2015-10-18 VITALS — BP 104/67 | HR 92 | Wt 150.0 lb

## 2015-10-18 DIAGNOSIS — O0993 Supervision of high risk pregnancy, unspecified, third trimester: Secondary | ICD-10-CM

## 2015-10-18 DIAGNOSIS — D561 Beta thalassemia: Secondary | ICD-10-CM

## 2015-10-18 DIAGNOSIS — O34219 Maternal care for unspecified type scar from previous cesarean delivery: Secondary | ICD-10-CM

## 2015-10-18 DIAGNOSIS — O99113 Other diseases of the blood and blood-forming organs and certain disorders involving the immune mechanism complicating pregnancy, third trimester: Secondary | ICD-10-CM

## 2015-10-18 NOTE — Patient Instructions (Signed)
Pain Relief During Labor and Delivery Everyone experiences pain differently, but labor causes severe pain for many women. The amount of pain you experience during labor and delivery depends on your pain tolerance, contraction strength, and your baby's size and position. There are many ways to prepare for and deal with the pain, including:   Taking prenatal classes to learn about labor and delivery. The more informed you are, the less anxious and afraid you may be. This can help lessen the pain.  Taking pain-relieving medicine during labor and delivery.  Learning breathing and relaxation techniques.  Taking a shower or bath.  Getting massaged.  Changing positions.  Placing an ice pack on your back. Discuss your pain control options with your health care provider during your prenatal visits.  WHAT ARE THE TWO TYPES OF PAIN-RELIEVING MEDICINES? 1. Analgesics. These are medicines that decrease pain without total loss of feeling or muscle movement. 2. Anesthetics. These are medicines that block all feeling, including pain. There can be minor side effects of both types, such as nausea, trouble concentrating, becoming sleepy, and lowering the heart rate of the baby. However, health care providers are careful to give doses that will not seriously affect the baby.  WHAT ARE THE SPECIFIC TYPES OF ANALGESICS AND ANESTHETICS? Systemic Analgesic Systemic pain medicines affect your whole body rather than focusing pain relief on the area of your body experiencing pain. This type of medicine is given either through an IV tube in your vein or by a shot (injection) into your muscle. This medicine will lessen your pain but will not stop it completely. It may also make you sleepy, but it will not make you lose consciousness.  Local Anesthetic Local anesthetic isused tonumb a small area of your body. The medicine is injected into the area of nerves that carry feeling to the vagina, vulva, or the area between  the vagina and anus (perineum).  General Anesthetic This type of medicine causes you to lose consciousness so you do not feel pain. It is usually used only in emergency situations during labor. It is given through an IV tube or face mask. Paracervical Block A paracervical block is a form of local anesthesia given during labor. Numbing medicine is injected into the right and left sides of the cervix and vagina. It helps to lessen the pain caused by contractions and stretching of the cervix. It may have to be given more than once.  Pudendal Block A pudendal block is another form of local anesthesia. It is used to relieve the pain associated with pushing or stretching of the perineum at the time of delivery. An injection is given deep through the vaginal wall into the pudendal nerve in the pelvis, numbing the perineum.  Epidural Anesthetic An epidural is an injection of numbing medicine given in the lower back and into the epidural space near your spinal cord. The epidural numbs the lower half of your body. You may be able to move your legs but will not be allowed to walk. Epidurals can be used for labor, delivery, or cesarean deliveries.  To prevent the medicine from wearing off, a small tube (catheter) may be threaded into the epidural space and taped in place to prevent it from slipping out. Medicine can then be given continuously in small doses through the tube until you deliver. Spinal Block A spinal block is similar to an epidural, but the medicine is injected into the spinal fluid, not the epidural space. A spinal block is only given   once. It starts to relieve pain quickly but lasts only 1-2 hours. Spinal blocks can also be used for cesarean deliveries.  Combined Spinal-Epidural Block Combined spinal-epidural blocks combine the benefits of both the spinal and epidural blocks. The spinal part acts quickly to relieve pain and the epidural provides continuous pain relief. Hydrotherapy Immersion in  warm water during labor may provide comfort and relaxation. It may also help to lessen pain, the use of anesthesia, and the length of labor. However, immersion in water during the delivery (water birth) may have some risk involved and studies to determine safety and risks are ongoing. If you are a healthy woman who is expecting an uncomplicated birth, talk with your health care provider to see if water birth is an option for you.    This information is not intended to replace advice given to you by your health care provider. Make sure you discuss any questions you have with your health care provider.   Document Released: 05/30/2008 Document Revised: 02/16/2013 Document Reviewed: 07/02/2012 Elsevier Interactive Patient Education 2016 Elsevier Inc.  

## 2015-10-18 NOTE — Progress Notes (Deleted)
Subjective:  Christine Turner is a 40 y.o. [id number] at 8581w1d being seen today for ongoing prenatal care.  She is currently monitored for the following issues for this {Blank single:19197::"high-risk","low-risk"} pregnancy and has Supervision of high risk pregnancy, antepartum; AMA (advanced maternal age) multigravida 35+; Previous cesarean delivery affecting pregnancy, antepartum; Beta thalassemia (HCC); and Left ovarian cyst - simple 2.5 cm on her problem list.  Patient reports {sx:14538}.   .  .   . Denies leaking of fluid.   The following portions of the patient's history were reviewed and updated as appropriate: allergies, current medications, past family history, past medical history, past social history, past surgical history and problem list. Problem list updated.  Objective:  There were no vitals filed for this visit.  Fetal Status:           General:  Alert, oriented and cooperative. Patient is in no acute distress.  Skin: Skin is warm and dry. No rash noted.   Cardiovascular: Normal heart rate noted  Respiratory: Normal respiratory effort, no problems with respiration noted  Abdomen: Soft, gravid, appropriate for gestational age.       Pelvic:  {Blank single:19197::"Cervical exam performed","Cervical exam deferred"}        Extremities: Normal range of motion.     Mental Status: Normal mood and affect. Normal behavior. Normal judgment and thought content.   Urinalysis:      Assessment and Plan:  Pregnancy: [id number] at 281w1d  There are no diagnoses linked to this encounter. {Blank single:19197::"Term","Preterm"} labor symptoms and general obstetric precautions including but not limited to vaginal bleeding, contractions, leaking of fluid and fetal movement were reviewed in detail with the patient. Please refer to After Visit Summary for other counseling recommendations.  No Follow-up on file.   Federico FlakeKimberly Niles Lalitha Ilyas, MD

## 2015-10-18 NOTE — Progress Notes (Signed)
Subjective:  Christine Turner is a [age] y.o. [id number] at 7452w1d being seen today for ongoing prenatal care.  She is currently monitored for the following issues for this high-risk pregnancy and has Supervision of high risk pregnancy, antepartum; AMA (advanced maternal age) multigravida 35+; Previous cesarean delivery affecting pregnancy, antepartum; Beta thalassemia (HCC); and Left ovarian cyst - simple 2.5 cm on her problem list.  Patient reports no complaints.  Contractions: Irritability. Vag. Bleeding: None.  Movement: Present. Denies leaking of fluid.   The following portions of the patient's history were reviewed and updated as appropriate: allergies, current medications, past family history, past medical history, past social history, past surgical history and problem list. Problem list updated.  Objective:   Vitals:   10/18/15 1401  BP: 104/67  Pulse: 92  Weight: 150 lb (68 kg)    Fetal Status: Fetal Heart Rate (bpm): 136   Movement: Present  Presentation: Vertex  General:  Alert, oriented and cooperative. Patient is in no acute distress.  Skin: Skin is warm and dry. No rash noted.   Cardiovascular: Normal heart rate noted  Respiratory: Normal respiratory effort, no problems with respiration noted  Abdomen: Soft, gravid, appropriate for gestational age. Pain/Pressure: Present     Pelvic:  Cervical exam deferred        Extremities: Normal range of motion.  Edema: Trace  Mental Status: Normal mood and affect. Normal behavior. Normal judgment and thought content.   Urinalysis: Urine Protein: Trace Urine Glucose: Negative  Assessment and Plan:  Pregnancy: [id number] at 6052w1d  1. Supervision of high risk pregnancy, antepartum, third trimester - Up to date  2. Previous cesarean delivery affecting pregnancy, antepartum - planning on TOLAC, signed consent  3. Beta thalassemia (HCC) - baseline HGB 9.3  Term labor symptoms and general obstetric precautions including but not limited to  vaginal bleeding, contractions, leaking of fluid and fetal movement were reviewed in detail with the patient. Please refer to After Visit Summary for other counseling recommendations.   Return in about 1 week (around 10/25/2015) for Routine prenatal care.   Federico FlakeKimberly Niles Sarahelizabeth Conway, MD

## 2015-10-24 ENCOUNTER — Encounter: Payer: PRIVATE HEALTH INSURANCE | Admitting: Obstetrics & Gynecology

## 2015-10-25 ENCOUNTER — Encounter: Payer: Self-pay | Admitting: Advanced Practice Midwife

## 2015-10-25 DIAGNOSIS — O4100X0 Oligohydramnios, unspecified trimester, not applicable or unspecified: Secondary | ICD-10-CM | POA: Insufficient documentation

## 2015-10-26 ENCOUNTER — Ambulatory Visit (INDEPENDENT_AMBULATORY_CARE_PROVIDER_SITE_OTHER): Payer: PRIVATE HEALTH INSURANCE | Admitting: Family Medicine

## 2015-10-26 VITALS — BP 112/70 | HR 98 | Wt 146.0 lb

## 2015-10-26 DIAGNOSIS — O09523 Supervision of elderly multigravida, third trimester: Secondary | ICD-10-CM

## 2015-10-26 DIAGNOSIS — O0993 Supervision of high risk pregnancy, unspecified, third trimester: Secondary | ICD-10-CM

## 2015-10-26 DIAGNOSIS — O34219 Maternal care for unspecified type scar from previous cesarean delivery: Secondary | ICD-10-CM

## 2015-10-26 NOTE — Progress Notes (Signed)
   PRENATAL VISIT NOTE  Subjective:  Christine Turner is a 40 y.o. [id number] at 6032w2d being seen today for ongoing prenatal care.  She is currently monitored for the following issues for this high-risk pregnancy and has Supervision of high risk pregnancy, antepartum; AMA (advanced maternal age) multigravida 35+; Previous cesarean delivery affecting pregnancy, antepartum; Beta thalassemia (HCC); Left ovarian cyst - simple 2.5 cm; and Low amniotic fluid on her problem list.  Patient reports no complaints.  Contractions: Irregular. Vag. Bleeding: None.  Movement: Present. Denies leaking of fluid.   The following portions of the patient's history were reviewed and updated as appropriate: allergies, current medications, past family history, past medical history, past social history, past surgical history and problem list. Problem list updated.  Objective:   Vitals:   10/26/15 1118  BP: 112/70  Pulse: 98  Weight: 146 lb (66.2 kg)    Fetal Status:     Movement: Present     General:  Alert, oriented and cooperative. Patient is in no acute distress.  Skin: Skin is warm and dry. No rash noted.   Cardiovascular: Normal heart rate noted  Respiratory: Normal respiratory effort, no problems with respiration noted  Abdomen: Soft, gravid, appropriate for gestational age. Pain/Pressure: Present     Pelvic:  Cervical exam performed Dilation: 1 Effacement (%): Thick Station: Ballotable  Extremities: Normal range of motion.  Edema: Trace  Mental Status: Normal mood and affect. Normal behavior. Normal judgment and thought content.   Urinalysis:      Assessment and Plan:  Pregnancy: [id number] at 8432w2d  1. Supervision of high risk pregnancy, antepartum, third trimester FHT and FH normal  2. AMA (advanced maternal age) multigravida 35+, third trimester NST twice weekly.  3. Previous cesarean delivery affecting pregnancy, antepartum TOLAC  Term labor symptoms and general obstetric precautions including  but not limited to vaginal bleeding, contractions, leaking of fluid and fetal movement were reviewed in detail with the patient. Please refer to After Visit Summary for other counseling recommendations.  No Follow-up on file.  Levie HeritageJacob J Stinson, DO

## 2015-10-26 NOTE — Addendum Note (Signed)
Addended by: Arne ClevelandHUTCHINSON, MANDY J on: 10/26/2015 04:40 PM   Modules accepted: [employer]od[employer]Set

## 2015-10-26 NOTE — Addendum Note (Signed)
Addended by: Arne ClevelandHUTCHINSON, Niaja Stickley J on: 10/26/2015 04:48 PM   Modules accepted: [employer]od[employer]Set

## 2015-10-31 ENCOUNTER — Ambulatory Visit (INDEPENDENT_AMBULATORY_CARE_PROVIDER_SITE_OTHER): Payer: PRIVATE HEALTH INSURANCE | Admitting: *Deleted

## 2015-10-31 DIAGNOSIS — O0993 Supervision of high risk pregnancy, unspecified, third trimester: Secondary | ICD-10-CM | POA: Diagnosis not present

## 2015-10-31 NOTE — Progress Notes (Signed)
NST reviewed and reactive.  

## 2015-11-01 ENCOUNTER — Encounter: Payer: PRIVATE HEALTH INSURANCE | Admitting: Family Medicine

## 2015-11-03 ENCOUNTER — Ambulatory Visit (INDEPENDENT_AMBULATORY_CARE_PROVIDER_SITE_OTHER): Payer: PRIVATE HEALTH INSURANCE | Admitting: Obstetrics & Gynecology

## 2015-11-03 VITALS — BP 110/58 | Wt 149.0 lb

## 2015-11-03 DIAGNOSIS — O34219 Maternal care for unspecified type scar from previous cesarean delivery: Secondary | ICD-10-CM

## 2015-11-03 DIAGNOSIS — O0993 Supervision of high risk pregnancy, unspecified, third trimester: Secondary | ICD-10-CM

## 2015-11-03 DIAGNOSIS — O09523 Supervision of elderly multigravida, third trimester: Secondary | ICD-10-CM | POA: Diagnosis not present

## 2015-11-03 NOTE — Patient Instructions (Signed)
Labor Induction Labor induction is when steps are taken to cause a pregnant woman to begin the labor process. Most women go into labor on their own between 37 weeks and 42 weeks of the pregnancy. When this does not happen or when there is a medical need, methods may be used to induce labor. Labor induction causes a pregnant woman's uterus to contract. It also causes the cervix to soften (ripen), open (dilate), and thin out (efface). Usually, labor is not induced before 39 weeks of the pregnancy unless there is a problem with the baby or mother.  Before inducing labor, your health care provider will consider a number of factors, including the following:  The medical condition of you and the baby.   How many weeks along you are.   The status of the baby's lung maturity.   The condition of the cervix.   The position of the baby.  WHAT ARE THE REASONS FOR LABOR INDUCTION? Labor may be induced for the following reasons:  The health of the baby or mother is at risk.   The pregnancy is overdue by 1 week or more.   The water breaks but labor does not start on its own.   The mother has a health condition or serious illness, such as high blood pressure, infection, placental abruption, or diabetes.  The amniotic fluid amounts are low around the baby.   The baby is distressed.  Convenience or wanting the baby to be born on a certain date is not a reason for inducing labor. WHAT METHODS ARE USED FOR LABOR INDUCTION? Several methods of labor induction may be used, such as:   Prostaglandin medicine. This medicine causes the cervix to dilate and ripen. The medicine will also start contractions. It can be taken by mouth or by inserting a suppository into the vagina.   Inserting a thin tube (catheter) with a balloon on the end into the vagina to dilate the cervix. Once inserted, the balloon is expanded with water, which causes the cervix to open.   Stripping the membranes. Your health  care provider separates amniotic sac tissue from the cervix, causing the cervix to be stretched and causing the release of a hormone called progesterone. This may cause the uterus to contract. It is often done during an office visit. You will be sent home to wait for the contractions to begin. You will then come in for an induction.   Breaking the water. Your health care provider makes a hole in the amniotic sac using a small instrument. Once the amniotic sac breaks, contractions should begin. This may still take hours to see an effect.   Medicine to trigger or strengthen contractions. This medicine is given through an IV access tube inserted into a vein in your arm.  All of the methods of induction, besides stripping the membranes, will be done in the hospital. Induction is done in the hospital so that you and the baby can be carefully monitored.  HOW LONG DOES IT TAKE FOR LABOR TO BE INDUCED? Some inductions can take up to 2-3 days. Depending on the cervix, it usually takes less time. It takes longer when you are induced early in the pregnancy or if this is your first pregnancy. If a mother is still pregnant and the induction has been going on for 2-3 days, either the mother will be sent home or a cesarean delivery will be needed. WHAT ARE THE RISKS ASSOCIATED WITH LABOR INDUCTION? Some of the risks of induction include:     Changes in fetal heart rate, such as too high, too low, or erratic.   Fetal distress.   Chance of infection for the mother and baby.   Increased chance of having a cesarean delivery.   Breaking off (abruption) of the placenta from the uterus (rare).   Uterine rupture (very rare).  When induction is needed for medical reasons, the benefits of induction may outweigh the risks. WHAT ARE SOME REASONS FOR NOT INDUCING LABOR? Labor induction should not be done if:   It is shown that your baby does not tolerate labor.   You have had previous surgeries on your  uterus, such as a myomectomy or the removal of fibroids.   Your placenta lies very low in the uterus and blocks the opening of the cervix (placenta previa).   Your baby is not in a head-down position.   The umbilical cord drops down into the birth canal in front of the baby. This could cut off the baby's blood and oxygen supply.   You have had a previous cesarean delivery.   There are unusual circumstances, such as the baby being extremely premature.    This information is not intended to replace advice given to you by your health care provider. Make sure you discuss any questions you have with your health care provider.   Document Released: 07/03/2006 Document Revised: 03/04/2014 Document Reviewed: 09/10/2012 Elsevier Interactive Patient Education 2016 [employer]  

## 2015-11-03 NOTE — Progress Notes (Signed)
   PRENATAL VISIT NOTE  Subjective:  Christine Turner is a 40 y.o. [id number] at 3665w3d being seen today for ongoing prenatal care.  She is currently monitored for the following issues for this high-risk pregnancy and has Supervision of high risk pregnancy, antepartum; AMA (advanced maternal age) multigravida 35+; Previous cesarean delivery affecting pregnancy, antepartum; Beta thalassemia (HCC); Left ovarian cyst - simple 2.5 cm; and Low amniotic fluid on her problem list.  Patient reports no complaints.  Contractions: Irregular. Vag. Bleeding: None.  Movement: Present. Denies leaking of fluid.   The following portions of the patient's history were reviewed and updated as appropriate: allergies, current medications, past family history, past medical history, past social history, past surgical history and problem list. Problem list updated.  Objective:   Vitals:   11/03/15 0916  BP: (!) 110/58  Weight: 149 lb (67.6 kg)    Fetal Status: Fetal Heart Rate (bpm): RNST Fundal Height: 36 cm Movement: Present  Presentation: Vertex  General:  Alert, oriented and cooperative. Patient is in no acute distress.  Skin: Skin is warm and dry. No rash noted.   Cardiovascular: Normal heart rate noted  Respiratory: Normal respiratory effort, no problems with respiration noted  Abdomen: Soft, gravid, appropriate for gestational age. Pain/Pressure: Present     Pelvic:  Cervical exam performed Dilation: 1 Effacement (%): 50 Station: -3  Extremities: Normal range of motion.  Edema: Trace  Mental Status: Normal mood and affect. Normal behavior. Normal judgment and thought content.   Urinalysis: Urine Protein: Trace Urine Glucose: Negative NST performed today was reviewed and was found to be reactive.  Continue recommended antenatal testing and prenatal care.  Assessment and Plan:  Pregnancy: [id number] at 5165w3d  1. AMA (advanced maternal age) multigravida 35+, third trimester IOL scheduled on 11/07/15.  2.  Previous cesarean delivery affecting pregnancy, antepartum Still desires TOLAC  3. Supervision of high risk pregnancy, antepartum, third trimester Term labor symptoms and general obstetric precautions including but not limited to vaginal bleeding, contractions, leaking of fluid and fetal movement were reviewed in detail with the patient. Please refer to After Visit Summary for other counseling recommendations.  Return in about 6 weeks (around 12/15/2015) for Postpartum check.  Tereso NewcomerUgonna A Anyanwu, MD

## 2015-11-05 ENCOUNTER — Encounter (HOSPITAL_COMMUNITY): Payer: Self-pay | Admitting: *Deleted

## 2015-11-05 ENCOUNTER — Inpatient Hospital Stay (HOSPITAL_COMMUNITY)
Admission: AD | Admit: 2015-11-05 | Discharge: 2015-11-05 | Disposition: A | Discharge status: Home / Self Care | Payer: PRIVATE HEALTH INSURANCE | Source: Ambulatory Visit | Attending: Obstetrics and Gynecology | Admitting: Obstetrics and Gynecology

## 2015-11-05 DIAGNOSIS — O34219 Maternal care for unspecified type scar from previous cesarean delivery: Secondary | ICD-10-CM

## 2015-11-05 DIAGNOSIS — Z3493 Encounter for supervision of normal pregnancy, unspecified, third trimester: Secondary | ICD-10-CM | POA: Insufficient documentation

## 2015-11-05 DIAGNOSIS — O09523 Supervision of elderly multigravida, third trimester: Secondary | ICD-10-CM

## 2015-11-05 DIAGNOSIS — O0993 Supervision of high risk pregnancy, unspecified, third trimester: Secondary | ICD-10-CM

## 2015-11-05 NOTE — Discharge Instructions (Signed)
Vaginal Birth After Cesarean Delivery Vaginal birth after cesarean delivery (VBAC) is giving birth vaginally after previously delivering a baby by a cesarean. In the past, if a woman had a cesarean delivery, all births afterward would be done by cesarean delivery. This is no longer true. It can be safe for the mother to try a vaginal delivery after having a cesarean delivery.  It is important to discuss VBAC with your health care provider early in the pregnancy so you can understand the risks, benefits, and options. It will give you time to decide what is best in your particular case. The final decision about whether to have a VBAC or repeat cesarean delivery should be between you and your health care provider. Any changes in your health or your baby's health during your pregnancy may make it necessary to change your initial decision about VBAC.  WOMEN WHO PLAN TO HAVE A VBAC SHOULD CHECK WITH THEIR HEALTH CARE PROVIDER TO BE SURE THAT:  The previous cesarean delivery was done with a low transverse uterine cut (incision) (not a vertical classical incision).   The birth canal is big enough for the baby.   There were no other operations on the uterus.   An electronic fetal monitor (EFM) will be on at all times during labor.   An operating room will be available and ready in case an emergency cesarean delivery is needed.   A health care provider and surgical nursing staff will be available at all times during labor to be ready to do an emergency delivery cesarean if necessary.   An anesthesiologist will be present in case an emergency cesarean delivery is needed.   The nursery is prepared and has adequate personnel and necessary equipment available to care for the baby in case of an emergency cesarean delivery. BENEFITS OF VBAC  Shorter stay in the hospital.   Avoidance of risks associated with cesarean delivery, such as:  Surgical complications, such as opening of the incision or  hernia in the incision.  Injury to other organs.  Fever. This can occur if an infection develops after surgery. It can also occur as a reaction to the medicine given to make you numb during the surgery.  Less blood loss and need for blood transfusions.  Lower risk of blood clots and infection.  Shorter recovery.   Decreased risk for having to remove the uterus (hysterectomy).   Decreased risk for the placenta to completely or partially cover the opening of the uterus (placenta previa) with a future pregnancy.   Decrease risk in future labor and delivery. RISKS OF A VBAC  Tearing (rupture) of the uterus. This is occurs in less than 1% of VBACs. The risk of this happening is higher if:  Steps are taken to begin the labor process (induce labor) or stimulate or strengthen contractions (augment labor).   Medicine is used to soften (ripen) the cervix.  Having to remove the uterus (hysterectomy) if it ruptures. VBAC SHOULD NOT BE DONE IF:  The previous cesarean delivery was done with a vertical (classical) or T-shaped incision or you do not know what kind of incision was made.   You had a ruptured uterus.   You have had certain types of surgery on your uterus, such as removal of uterine fibroids. Ask your health care provider about other types of surgeries that prevent you from having a VBAC.  You have certain medical or childbirth (obstetrical) problems.   There are problems with the baby.   You   have had two previous cesarean deliveries and no vaginal deliveries. OTHER FACTS TO KNOW ABOUT VBAC:  It is safe to have an epidural anesthetic with VBAC.   It is safe to turn the baby from a breech position (attempt an external cephalic version).   It is safe to try a VBAC with twins.   VBAC may not be successful if your baby weights 8.8 lb (4 kg) or more. However, weight predictions are not always accurate and should not be used alone to decide if VBAC is right for  you.  There is an increased failure rate if the time between the cesarean delivery and VBAC is less than 19 months.   Your health care provider may advise against a VBAC if you have preeclampsia (high blood pressure, protein in the urine, and swelling of face and extremities).   VBAC is often successful if you previously gave birth vaginally.   VBAC is often successful when the labor starts spontaneously before the due date.   Delivering a baby through a VBAC is similar to having a normal spontaneous vaginal delivery.   This information is not intended to replace advice given to you by your health care provider. Make sure you discuss any questions you have with your health care provider.   Document Released: 08/04/2006 Document Revised: 03/04/2014 Document Reviewed: 09/10/2012 Elsevier Interactive Patient Education [employer] Third Trimester of Pregnancy The third trimester is from week 29 through week 42, months 7 through 9. The third trimester is a time when the fetus is growing rapidly. At the end of the ninth month, the fetus is about 20 inches in length and weighs 6-10 pounds.  BODY CHANGES Your body goes through many changes during pregnancy. The changes vary from woman to woman.   Your weight will continue to increase. You can expect to gain 25-35 pounds (11-16 kg) by the end of the pregnancy.  You may begin to get stretch marks on your hips, abdomen, and breasts.  You may urinate more often because the fetus is moving lower into your pelvis and pressing on your bladder.  You may develop or continue to have heartburn as a result of your pregnancy.  You may develop constipation because certain hormones are causing the muscles that push waste through your intestines to slow down.  You may develop hemorrhoids or swollen, bulging veins (varicose veins).  You may have pelvic pain because of the weight gain and pregnancy hormones relaxing your joints between the bones  in your pelvis. Backaches may result from overexertion of the muscles supporting your posture.  You may have changes in your hair. These can include thickening of your hair, rapid growth, and changes in texture. Some women also have hair loss during or after pregnancy, or hair that feels dry or thin. Your hair will most likely return to normal after your baby is born.  Your breasts will continue to grow and be tender. A yellow discharge may leak from your breasts called colostrum.  Your belly button may stick out.  You may feel short of breath because of your expanding uterus.  You may notice the fetus "dropping," or moving lower in your abdomen.  You may have a bloody mucus discharge. This usually occurs a few days to a week before labor begins.  Your cervix becomes thin and soft (effaced) near your due date. WHAT TO EXPECT AT YOUR PRENATAL EXAMS  You will have prenatal exams every 2 weeks until week 36. Then, you will  have weekly prenatal exams. During a routine prenatal visit:  You will be weighed to make sure you and the fetus are growing normally.  Your blood pressure is taken.  Your abdomen will be measured to track your baby's growth.  The fetal heartbeat will be listened to.  Any test results from the previous visit will be discussed.  You may have a cervical check near your due date to see if you have effaced. At around 36 weeks, your caregiver will check your cervix. At the same time, your caregiver will also perform a test on the secretions of the vaginal tissue. This test is to determine if a type of bacteria, Group B streptococcus, is present. Your caregiver will explain this further. Your caregiver may ask you:  What your birth plan is.  How you are feeling.  If you are feeling the baby move.  If you have had any abnormal symptoms, such as leaking fluid, bleeding, severe headaches, or abdominal cramping.  If you are using any tobacco products, including  cigarettes, chewing tobacco, and electronic cigarettes.  If you have any questions. Other tests or screenings that may be performed during your third trimester include:  Blood tests that check for low iron levels (anemia).  Fetal testing to check the health, activity level, and growth of the fetus. Testing is done if you have certain medical conditions or if there are problems during the pregnancy.  HIV (human immunodeficiency virus) testing. If you are at high risk, you may be screened for HIV during your third trimester of pregnancy. FALSE LABOR You may feel small, irregular contractions that eventually go away. These are called Braxton Hicks contractions, or false labor. Contractions may last for hours, days, or even weeks before true labor sets in. If contractions come at regular intervals, intensify, or become painful, it is best to be seen by your caregiver.  SIGNS OF LABOR   Menstrual-like cramps.  Contractions that are 5 minutes apart or less.  Contractions that start on the top of the uterus and spread down to the lower abdomen and back.  A sense of increased pelvic pressure or back pain.  A watery or bloody mucus discharge that comes from the vagina. If you have any of these signs before the 37th week of pregnancy, call your caregiver right away. You need to go to the hospital to get checked immediately. HOME CARE INSTRUCTIONS   Avoid all smoking, herbs, alcohol, and unprescribed drugs. These chemicals affect the formation and growth of the baby.  Do not use any tobacco products, including cigarettes, chewing tobacco, and electronic cigarettes. If you need help quitting, ask your health care provider. You may receive counseling support and other resources to help you quit.  Follow your caregiver's instructions regarding medicine use. There are medicines that are either safe or unsafe to take during pregnancy.  Exercise only as directed by your caregiver. Experiencing uterine  cramps is a good sign to stop exercising.  Continue to eat regular, healthy meals.  Wear a good support bra for breast tenderness.  Do not use hot tubs, steam rooms, or saunas.  Wear your seat belt at all times when driving.  Avoid raw meat, uncooked cheese, cat litter boxes, and soil used by cats. These carry germs that can cause birth defects in the baby.  Take your prenatal vitamins.  Take 1500-2000 mg of calcium daily starting at the 20th week of pregnancy until you deliver your baby.  Try taking a stool softener (if  your caregiver approves) if you develop constipation. Eat more high-fiber foods, such as fresh vegetables or fruit and whole grains. Drink plenty of fluids to keep your urine clear or pale yellow.  Take warm sitz baths to soothe any pain or discomfort caused by hemorrhoids. Use hemorrhoid cream if your caregiver approves.  If you develop varicose veins, wear support hose. Elevate your feet for 15 minutes, 3-4 times a day. Limit salt in your diet.  Avoid heavy lifting, wear low heal shoes, and practice good posture.  Rest a lot with your legs elevated if you have leg cramps or low back pain.  Visit your dentist if you have not gone during your pregnancy. Use a soft toothbrush to brush your teeth and be gentle when you floss.  A sexual relationship may be continued unless your caregiver directs you otherwise.  Do not travel far distances unless it is absolutely necessary and only with the approval of your caregiver.  Take prenatal classes to understand, practice, and ask questions about the labor and delivery.  Make a trial run to the hospital.  Pack your hospital bag.  Prepare the baby's nursery.  Continue to go to all your prenatal visits as directed by your caregiver. SEEK MEDICAL CARE IF:  You are unsure if you are in labor or if your water has broken.  You have dizziness.  You have mild pelvic cramps, pelvic pressure, or nagging pain in your  abdominal area.  You have persistent nausea, vomiting, or diarrhea.  You have a bad smelling vaginal discharge.  You have pain with urination. SEEK IMMEDIATE MEDICAL CARE IF:   You have a fever.  You are leaking fluid from your vagina.  You have spotting or bleeding from your vagina.  You have severe abdominal cramping or pain.  You have rapid weight loss or gain.  You have shortness of breath with chest pain.  You notice sudden or extreme swelling of your face, hands, ankles, feet, or legs.  You have not felt your baby move in over an hour.  You have severe headaches that do not go away with medicine.  You have vision changes.   This information is not intended to replace advice given to you by your health care provider. Make sure you discuss any questions you have with your health care provider.   Document Released: 02/05/2001 Document Revised: 03/04/2014 Document Reviewed: 04/14/2012 Elsevier Interactive Patient Education 2016 Else[employer] Inc[employer] Fetal Movement Counts Patient Name: __________________________________________________ Patient Due Date: ____________________ Performing a fetal movement count is highly recommended in high-risk pregnancies, but it is good for every pregnant woman to do. Your health care provider may ask you to start counting fetal movements at 28 weeks of the pregnancy. Fetal movements often increase:  After eating a full meal.  After physical activity.  After eating or drinking something sweet or cold.  At rest. Pay attention to when you feel the baby is most active. This will help you notice a pattern of your baby's sleep and wake cycles and what factors contribute to an increase in fetal movement. It is important to perform a fetal movement count at the same time each day when your baby is normally most active.  HOW TO COUNT FETAL MOVEMENTS 1. Find a quiet and comfortable area to sit or lie down on your left side. Lying on your left side  provides the best blood and oxygen circulation to your baby. 2. Write down the day and time on a sheet of paper  or in a journal. 3. Start counting kicks, flutters, swishes, rolls, or jabs in a 2-hour period. You should feel at least 10 movements within 2 hours. 4. If you do not feel 10 movements in 2 hours, wait 2-3 hours and count again. Look for a change in the pattern or not enough counts in 2 hours. SEEK MEDICAL CARE IF:  You feel less than 10 counts in 2 hours, tried twice.  There is no movement in over an hour.  The pattern is changing or taking longer each day to reach 10 counts in 2 hours.  You feel the baby is not moving as he or she usually does. Date: ____________ Movements: ____________ Start time: ____________ Doreatha Martin time: ____________  Date: ____________ Movements: ____________ Start time: ____________ Doreatha Martin time: ____________ Date: ____________ Movements: ____________ Start time: ____________ Doreatha Martin time: ____________ Date: ____________ Movements: ____________ Start time: ____________ Doreatha Martin time: ____________ Date: ____________ Movements: ____________ Start time: ____________ Doreatha Martin time: ____________ Date: ____________ Movements: ____________ Start time: ____________ Doreatha Martin time: ____________ Date: ____________ Movements: ____________ Start time: ____________ Doreatha Martin time: ____________ Date: ____________ Movements: ____________ Start time: ____________ Doreatha Martin time: ____________  Date: ____________ Movements: ____________ Start time: ____________ Doreatha Martin time: ____________ Date: ____________ Movements: ____________ Start time: ____________ Doreatha Martin time: ____________ Date: ____________ Movements: ____________ Start time: ____________ Doreatha Martin time: ____________ Date: ____________ Movements: ____________ Start time: ____________ Doreatha Martin time: ____________ Date: ____________ Movements: ____________ Start time: ____________ Doreatha Martin time: ____________ Date: ____________ Movements:  ____________ Start time: ____________ Doreatha Martin time: ____________ Date: ____________ Movements: ____________ Start time: ____________ Doreatha Martin time: ____________  Date: ____________ Movements: ____________ Start time: ____________ Doreatha Martin time: ____________ Date: ____________ Movements: ____________ Start time: ____________ Doreatha Martin time: ____________ Date: ____________ Movements: ____________ Start time: ____________ Doreatha Martin time: ____________ Date: ____________ Movements: ____________ Start time: ____________ Doreatha Martin time: ____________ Date: ____________ Movements: ____________ Start time: ____________ Doreatha Martin time: ____________ Date: ____________ Movements: ____________ Start time: ____________ Doreatha Martin time: ____________ Date: ____________ Movements: ____________ Start time: ____________ Doreatha Martin time: ____________  Date: ____________ Movements: ____________ Start time: ____________ Doreatha Martin time: ____________ Date: ____________ Movements: ____________ Start time: ____________ Doreatha Martin time: ____________ Date: ____________ Movements: ____________ Start time: ____________ Doreatha Martin time: ____________ Date: ____________ Movements: ____________ Start time: ____________ Doreatha Martin time: ____________ Date: ____________ Movements: ____________ Start time: ____________ Doreatha Martin time: ____________ Date: ____________ Movements: ____________ Start time: ____________ Doreatha Martin time: ____________ Date: ____________ Movements: ____________ Start time: ____________ Doreatha Martin time: ____________  Date: ____________ Movements: ____________ Start time: ____________ Doreatha Martin time: ____________ Date: ____________ Movements: ____________ Start time: ____________ Doreatha Martin time: ____________ Date: ____________ Movements: ____________ Start time: ____________ Doreatha Martin time: ____________ Date: ____________ Movements: ____________ Start time: ____________ Doreatha Martin time: ____________ Date: ____________ Movements: ____________ Start time: ____________ Doreatha Martin  time: ____________ Date: ____________ Movements: ____________ Start time: ____________ Doreatha Martin time: ____________ Date: ____________ Movements: ____________ Start time: ____________ Doreatha Martin time: ____________  Date: ____________ Movements: ____________ Start time: ____________ Doreatha Martin time: ____________ Date: ____________ Movements: ____________ Start time: ____________ Doreatha Martin time: ____________ Date: ____________ Movements: ____________ Start time: ____________ Doreatha Martin time: ____________ Date: ____________ Movements: ____________ Start time: ____________ Doreatha Martin time: ____________ Date: ____________ Movements: ____________ Start time: ____________ Doreatha Martin time: ____________ Date: ____________ Movements: ____________ Start time: ____________ Doreatha Martin time: ____________ Date: ____________ Movements: ____________ Start time: ____________ Doreatha Martin time: ____________  Date: ____________ Movements: ____________ Start time: ____________ Doreatha Martin time: ____________ Date: ____________ Movements: ____________ Start time: ____________ Doreatha Martin time: ____________ Date: ____________ Movements: ____________ Start time: ____________ Doreatha Martin time: ____________ Date: ____________ Movements: ____________ Start time: ____________ Doreatha Martin time: ____________ Date: ____________ Movements: ____________ Start time: ____________  Finish time: ____________ Date: ____________ Movements: ____________ Start time: ____________ Doreatha Martin time: ____________ Date: ____________ Movements: ____________ Start time: ____________ Doreatha Martin time: ____________  Date: ____________ Movements: ____________ Start time: ____________ Doreatha Martin time: ____________ Date: ____________ Movements: ____________ Start time: ____________ Doreatha Martin time: ____________ Date: ____________ Movements: ____________ Start time: ____________ Doreatha Martin time: ____________ Date: ____________ Movements: ____________ Start time: ____________ Doreatha Martin time: ____________ Date: ____________  Movements: ____________ Start time: ____________ Doreatha Martin time: ____________ Date: ____________ Movements: ____________ Start time: ____________ Doreatha Martin time: ____________   This information is not intended to replace advice given to you by your health care provider. Make sure you discuss any questions you have with your health care provider.   Document Released: 03/13/2006 Document Revised: 03/04/2014 Document Reviewed: 12/09/2011 Elsevier Interactive Patient Education 2016 [employer] of the uterus can occur throughout pregnancy. Contractions are not always a sign that you are in labor.  WHAT ARE BRAXTON HICKS CONTRACTIONS?  Contractions that occur before labor are called Braxton Hicks contractions, or false labor. Toward the end of pregnancy (32-34 weeks), these contractions can develop more often and may become more forceful. This is not true labor because these contractions do not result in opening (dilatation) and thinning of the cervix. They are sometimes difficult to tell apart from true labor because these contractions can be forceful and people have different pain tolerances. You should not feel embarrassed if you go to the hospital with false labor. Sometimes, the only way to tell if you are in true labor is for your health care provider to look for changes in the cervix. If there are no prenatal problems or other health problems associated with the pregnancy, it is completely safe to be sent home with false labor and await the onset of true labor. HOW CAN YOU TELL THE DIFFERENCE BETWEEN TRUE AND FALSE LABOR? False Labor  The contractions of false labor are usually shorter and not as hard as those of true labor.   The contractions are usually irregular.   The contractions are often felt in the front of the lower abdomen and in the groin.   The contractions may go away when you walk around or change positions while lying down.   The  contractions get weaker and are shorter lasting as time goes on.   The contractions do not usually become progressively stronger, regular, and closer together as with true labor.  True Labor  Contractions in true labor last 30-70 seconds, become very regular, usually become more intense, and increase in frequency.   The contractions do not go away with walking.   The discomfort is usually felt in the top of the uterus and spreads to the lower abdomen and low back.   True labor can be determined by your health care provider with an exam. This will show that the cervix is dilating and getting thinner.  WHAT TO REMEMBER  Keep up with your usual exercises and follow other instructions given by your health care provider.   Take medicines as directed by your health care provider.   Keep your regular prenatal appointments.   Eat and drink lightly if you think you are going into labor.   If Braxton Hicks contractions are making you uncomfortable:   Change your position from lying down or resting to walking, or from walking to resting.   Sit and rest in a tub of warm water.   Drink 2-3 glasses of water. Dehydration may cause these contractions.   Do slow and  deep breathing several times an hour.  WHEN SHOULD I SEEK IMMEDIATE MEDICAL CARE? Seek immediate medical care if:  Your contractions become stronger, more regular, and closer together.   You have fluid leaking or gushing from your vagina.   You have a fever.   You pass blood-tinged mucus.   You have vaginal bleeding.   You have continuous abdominal pain.   You have low back pain that you never had before.   You feel your baby's head pushing down and causing pelvic pressure.   Your baby is not moving as much as it used to.    This information is not intended to replace advice given to you by your health care provider. Make sure you discuss any questions you have with your health care provider.    Document Released: 02/11/2005 Document Revised: 02/16/2013 Document Reviewed: 11/23/2012 Elsevier Interactive Patient Education [employer]

## 2015-11-05 NOTE — Progress Notes (Signed)
Judeth HornErin Lawrence NP on unit and aware of pt's admission and status. Will reck cervix in an hour.

## 2015-11-05 NOTE — Progress Notes (Signed)
Dr. Omer JackMumaw reviewed FHR tracing.  States pt can be discharged home with labor precautions.

## 2015-11-05 NOTE — MAU Note (Signed)
Contracting since Saturday but alittle stronger now. Some bloody show. Prior C/S but desires TOLAC.Christine Turner. Denies LOF

## 2015-11-05 NOTE — Progress Notes (Signed)
Dr. Omer JackMumaw notified of pt in MAU. Notified that pt is a 40 y.o. [id number] at 546w5d.  Notified that pt is scheduled for induction Tuesday and came in today because she thought she was having contractions.  Notified that pt was 1, 50, -3, and vertex when checked by night shift and is still 1, 50, -3, and vertex upon recheck.  Provider asked to review FHR tracing.  Provider states she is in a pt's room and will review the FHR tracing and call back.

## 2015-11-06 ENCOUNTER — Inpatient Hospital Stay (HOSPITAL_COMMUNITY): Payer: BLUE CROSS/BLUE SHIELD | Admitting: Anesthesiology

## 2015-11-06 ENCOUNTER — Telehealth (HOSPITAL_COMMUNITY): Payer: Self-pay | Admitting: *Deleted

## 2015-11-06 ENCOUNTER — Inpatient Hospital Stay (HOSPITAL_COMMUNITY)
Admission: AD | Admit: 2015-11-06 | Discharge: 2015-11-09 | DRG: 775 | Disposition: A | Discharge status: Home / Self Care | Payer: BLUE CROSS/BLUE SHIELD | Source: Ambulatory Visit | Attending: Family Medicine | Admitting: Family Medicine

## 2015-11-06 ENCOUNTER — Ambulatory Visit (INDEPENDENT_AMBULATORY_CARE_PROVIDER_SITE_OTHER): Payer: PRIVATE HEALTH INSURANCE | Admitting: Obstetrics and Gynecology

## 2015-11-06 ENCOUNTER — Encounter (HOSPITAL_COMMUNITY): Payer: Self-pay | Admitting: *Deleted

## 2015-11-06 VITALS — BP 104/64 | HR 97 | Wt 148.0 lb

## 2015-11-06 DIAGNOSIS — O34211 Maternal care for low transverse scar from previous cesarean delivery: Secondary | ICD-10-CM | POA: Diagnosis present

## 2015-11-06 DIAGNOSIS — O0993 Supervision of high risk pregnancy, unspecified, third trimester: Secondary | ICD-10-CM

## 2015-11-06 DIAGNOSIS — O4103X0 Oligohydramnios, third trimester, not applicable or unspecified: Secondary | ICD-10-CM

## 2015-11-06 DIAGNOSIS — Z8249 Family history of ischemic heart disease and other diseases of the circulatory system: Secondary | ICD-10-CM

## 2015-11-06 DIAGNOSIS — Z833 Family history of diabetes mellitus: Secondary | ICD-10-CM

## 2015-11-06 DIAGNOSIS — O09523 Supervision of elderly multigravida, third trimester: Secondary | ICD-10-CM

## 2015-11-06 DIAGNOSIS — Z3A39 39 weeks gestation of pregnancy: Secondary | ICD-10-CM

## 2015-11-06 DIAGNOSIS — O34219 Maternal care for unspecified type scar from previous cesarean delivery: Secondary | ICD-10-CM

## 2015-11-06 DIAGNOSIS — D563 Thalassemia minor: Secondary | ICD-10-CM | POA: Diagnosis present

## 2015-11-06 DIAGNOSIS — O4100X0 Oligohydramnios, unspecified trimester, not applicable or unspecified: Secondary | ICD-10-CM | POA: Diagnosis present

## 2015-11-06 LAB — CBC
HCT: 32.3 % — ABNORMAL LOW (ref 36.0–46.0)
Hemoglobin: 10.9 g/dL — ABNORMAL LOW (ref 12.0–15.0)
MCH: 25.4 pg — ABNORMAL LOW (ref 26.0–34.0)
MCHC: 33.7 g/dL (ref 30.0–36.0)
MCV: 75.3 fL — ABNORMAL LOW (ref 78.0–100.0)
Platelets: 122 10*3/uL — ABNORMAL LOW (ref 150–400)
RBC: 4.29 MIL/uL (ref 3.87–5.11)
RDW: 22.8 % — ABNORMAL HIGH (ref 11.5–15.5)
WBC: 12.8 10*3/uL — ABNORMAL HIGH (ref 4.0–10.5)

## 2015-11-06 LAB — TYPE AND SCREEN
ABO/RH(D): B POS
Antibody Screen: NEGATIVE

## 2015-11-06 MED ORDER — OXYCODONE-ACETAMINOPHEN 5-325 MG PO TABS: 1.0000 | ORAL_TABLET | ORAL | Status: DC | PRN

## 2015-11-06 MED ORDER — FENTANYL CITRATE (PF) 100 MCG/2ML IJ SOLN
100.0000 ug | INTRAMUSCULAR | Status: DC | PRN
  Filled 2015-11-06 (×2): qty 2

## 2015-11-06 MED ORDER — TERBUTALINE SULFATE 1 MG/ML IJ SOLN
0.2500 mg | Freq: Once | INTRAMUSCULAR | Status: DC | PRN
  Filled 2015-11-06: qty 1

## 2015-11-06 MED ORDER — OXYCODONE-ACETAMINOPHEN 5-325 MG PO TABS: 2.0000 | ORAL_TABLET | ORAL | Status: DC | PRN

## 2015-11-06 MED ORDER — OXYTOCIN BOLUS FROM INFUSION: 500.0000 mL | Freq: Once | INTRAVENOUS | Status: DC

## 2015-11-06 MED ORDER — LACTATED RINGERS IV SOLN
INTRAVENOUS | Status: DC
  Administered 2015-11-06: via INTRAVENOUS
  Administered 2015-11-07: 999 mL via INTRAVENOUS
  Administered 2015-11-07: 03:00:00 via INTRAVENOUS

## 2015-11-06 MED ORDER — LIDOCAINE HCL (PF) 1 % IJ SOLN
30.0000 mL | INTRAMUSCULAR | Status: DC | PRN
  Filled 2015-11-06: qty 30

## 2015-11-06 MED ORDER — EPHEDRINE 5 MG/ML INJ
10.0000 mg | INTRAVENOUS | Status: DC | PRN
  Filled 2015-11-06: qty 4

## 2015-11-06 MED ORDER — ONDANSETRON HCL 4 MG/2ML IJ SOLN: 4.0000 mg | Freq: Four times a day (QID) | INTRAMUSCULAR | Status: DC | PRN

## 2015-11-06 MED ORDER — PHENYLEPHRINE 40 MCG/ML (10ML) SYRINGE FOR IV PUSH (FOR BLOOD PRESSURE SUPPORT)
80.0000 ug | PREFILLED_SYRINGE | INTRAVENOUS | Status: DC | PRN
  Filled 2015-11-06: qty 10
  Filled 2015-11-06: qty 5

## 2015-11-06 MED ORDER — FENTANYL CITRATE (PF) 100 MCG/2ML IJ SOLN
100.0000 ug | INTRAMUSCULAR | Status: DC | PRN
  Administered 2015-11-06 (×2): 100 ug via INTRAVENOUS

## 2015-11-06 MED ORDER — ACETAMINOPHEN 325 MG PO TABS: 650.0000 mg | ORAL_TABLET | ORAL | Status: DC | PRN

## 2015-11-06 MED ORDER — OXYTOCIN 40 UNITS IN LACTATED RINGERS INFUSION - SIMPLE MED: 2.5000 [IU]/h | INTRAVENOUS | Status: DC

## 2015-11-06 MED ORDER — LACTATED RINGERS IV SOLN
500.0000 mL | INTRAVENOUS | Status: DC | PRN
  Administered 2015-11-06 – 2015-11-07 (×2): 500 mL via INTRAVENOUS

## 2015-11-06 MED ORDER — PHENYLEPHRINE 40 MCG/ML (10ML) SYRINGE FOR IV PUSH (FOR BLOOD PRESSURE SUPPORT)
80.0000 ug | PREFILLED_SYRINGE | INTRAVENOUS | Status: DC | PRN
  Administered 2015-11-07: 03:00:00 via INTRAVENOUS
  Administered 2015-11-07: 80 ug via INTRAVENOUS
  Filled 2015-11-06: qty 5
  Filled 2015-11-06: qty 10

## 2015-11-06 MED ORDER — EPHEDRINE 5 MG/ML INJ: 10.0000 mg | INTRAVENOUS | Status: DC | PRN

## 2015-11-06 MED ORDER — TERBUTALINE SULFATE 1 MG/ML IJ SOLN: 0.2500 mg | Freq: Once | INTRAMUSCULAR | Status: DC | PRN

## 2015-11-06 MED ORDER — LIDOCAINE HCL (PF) 1 % IJ SOLN
INTRAMUSCULAR | Status: DC | PRN
  Administered 2015-11-06: 6 mL via EPIDURAL
  Administered 2015-11-06: 7 mL via EPIDURAL

## 2015-11-06 MED ORDER — OXYTOCIN 40 UNITS IN LACTATED RINGERS INFUSION - SIMPLE MED
1.0000 m[IU]/min | INTRAVENOUS | Status: DC
  Filled 2015-11-06: qty 1000

## 2015-11-06 MED ORDER — LACTATED RINGERS IV SOLN: 500.0000 mL | Freq: Once | INTRAVENOUS | Status: DC

## 2015-11-06 MED ORDER — SOD CITRATE-CITRIC ACID 500-334 MG/5ML PO SOLN: 30.0000 mL | ORAL | Status: DC | PRN

## 2015-11-06 MED ORDER — FLEET ENEMA 7-19 GM/118ML RE ENEM: 1.0000 | ENEMA | RECTAL | Status: DC | PRN

## 2015-11-06 MED ORDER — FENTANYL 2.5 MCG/ML BUPIVACAINE 1/10 % EPIDURAL INFUSION (WH - ANES)
14.0000 mL/h | INTRAMUSCULAR | Status: DC | PRN
  Administered 2015-11-06: 14 mL/h via EPIDURAL
  Filled 2015-11-06 (×2): qty 125

## 2015-11-06 MED ORDER — OXYTOCIN 40 UNITS IN LACTATED RINGERS INFUSION - SIMPLE MED
1.0000 m[IU]/min | INTRAVENOUS | Status: DC
  Administered 2015-11-06: 1 m[IU]/min via INTRAVENOUS
  Administered 2015-11-07: 2 m[IU]/min via INTRAVENOUS

## 2015-11-06 MED ORDER — DIPHENHYDRAMINE HCL 50 MG/ML IJ SOLN: 12.5000 mg | INTRAMUSCULAR | Status: DC | PRN

## 2015-11-06 NOTE — Progress Notes (Signed)
Prenatal Visit Note Date: 11/06/2015 Clinic: [facility]  Subjective:  Jenel LucksCarla Steen is a 40 y.o. [id number] at 5599w6d being seen today for ongoing prenatal care.  She is currently monitored for the following issues for this high-risk pregnancy and has Supervision of high risk pregnancy, antepartum; AMA (advanced maternal age) multigravida 35+; Previous cesarean delivery affecting pregnancy, antepartum; Beta thalassemia (HCC); Left ovarian cyst - simple 2.5 cm; and Low amniotic fluid on her problem list.  Patient reports low belly pressure and occasional UCs; she was seen in MAU yesterday morning and was 1/50/-3 and sent home after negative labor eval. No decreased FM or VB, LOF  Contractions: Irregular. Vag. Bleeding: None.  Movement: Present. Denies leaking of fluid.   The following portions of the patient's history were reviewed and updated as appropriate: allergies, current medications, past family history, past medical history, past social history, past surgical history and problem list. Problem list updated.  Objective:   Vitals:   11/06/15 1315  BP: 104/64  Pulse: 97  Weight: 148 lb (67.1 kg)    Fetal Status: Fetal Heart Rate (bpm): 152   Movement: Present     General:  Alert, oriented and cooperative. Patient is in no acute distress.  Skin: Skin is warm and dry. No rash noted.   Cardiovascular: Normal heart rate noted  Respiratory: Normal respiratory effort, no problems with respiration noted  Abdomen: Soft, gravid, appropriate for gestational age. Pain/Pressure: Present     Pelvic:  1/80/-1/soft/midposition        Extremities: Normal range of motion.  Edema: Trace  Mental Status: Normal mood and affect. Normal behavior. Normal judgment and thought content.   Urinalysis: Urine Protein: Trace Urine Glucose: Negative  Assessment and Plan:  Pregnancy: [id number] at 3399w6d  *Pregnancy: routine care *h/o low AFI: AFI 4 today and will get NST, but since oligo at  term, will send over to L&D for IOL. Dr. Adrian BlackwaterStinson called aware *GBS neg: *TOLAC: subtle late decel noted and another questionable one vs maternal HR but she was one her back. She had a very rNST with normal baseline and moderate variability. BPP easily done and 6/8 (oligo), cephalic. I told her that I would recommend CST when she arrives to ensure fetus will be able to tolerate labor. Pt to go straight to L&D   Please refer to After Visit Summary for other counseling recommendations.   [place] Bingharlie Patria Warzecha, MD

## 2015-11-06 NOTE — Anesthesia Pain Management Evaluation Note (Signed)
  CRNA Pain Management Visit Note  Patient: Christine Turner, 40 y.o., female  "Hello [facility] am [facility] We have an anesthesia team available at all times to provide care throughout the hospital, including epidural management and anesthesia for C-section. I don't know your plan for the delivery whether it a natural birth, water birth, IV sedation, nitrous supplementation, doula or epidural, but we want to meet your pain goals."   1.Was your pain managed to your expectations on prior hospitalizations?   Yes   2.What is your expectation for pain management during this hospitalization?     IV pain meds, possible epidural  3.How can we help you reach that goal? Possible epidural  Record the patient's initial score and the patient's pain goal.   Pain: 5  Pain Goal: 7 [facility] wants you to be able to say your pain was always managed very well.  Christine Turner 11/06/2015

## 2015-11-06 NOTE — Telephone Encounter (Signed)
Preadmission screen  

## 2015-11-06 NOTE — Anesthesia Preprocedure Evaluation (Signed)

## 2015-11-06 NOTE — Progress Notes (Signed)
LABOR PROGRESS NOTE  Christine Turner is a 40 y.o. [id number] at 6542w6d  admitted for IOL for oligohydramnios.   Subjective: Patient reports feeling contractions. Pain not well controlled with nitrous oxide. Requested IV pain medication prior to cervical exam, and given.   Objective: BP 111/65   Pulse 81   Temp 98.7 F (37.1 C) (Oral)   Resp 18   LMP 01/31/2015 (Exact Date)   SpO2 100%  or  Vitals:   11/06/15 1807 11/06/15 1950 11/06/15 1955  BP:  111/65   Pulse:  81   Resp: 18    Temp: 98.7 F (37.1 C)    TempSrc: Oral    SpO2:   100%    Dilation: (P) 1.5 Effacement (%): (P) 70 Cervical Position: (P) Posterior Station: (P) 0 Presentation: (P) Vertex Exam by:: S Nix RN  Toco: irregular ctx FHT: baseline rate 145, moderate variability +acel, no decel  Labs: Lab Results  Component Value Date   WBC 12.8 (H) 11/06/2015   HGB 10.9 (L) 11/06/2015   HCT 32.3 (L) 11/06/2015   MCV 75.3 (L) 11/06/2015   PLT 122 (L) 11/06/2015    Patient Active Problem List   Diagnosis Date Noted  . Oligohydramnios 11/06/2015  . Low amniotic fluid 10/25/2015  . Supervision of high risk pregnancy, antepartum 07/25/2015  . AMA (advanced maternal age) multigravida 35+ 07/25/2015  . Previous cesarean delivery affecting pregnancy, antepartum 07/25/2015  . Beta thalassemia (HCC) 07/25/2015  . Left ovarian cyst - simple 2.5 cm 07/25/2015    Assessment / Plan: 40 y.o. [id number] at 8042w6d here for IOL for oligohydramnios. TOLAC.   Labor: Foley bulb and low-dose Pit for cervical ripening Fetal Wellbeing:  Category I Pain Control:  IV pain meds. May have epidural  Anticipated MOD:  SVD  Frederik PearJulie P Shanavia Makela, MD 11/06/2015, 9:57 PM

## 2015-11-06 NOTE — Anesthesia Procedure Notes (Signed)
Epidural Patient location during procedure: OB Start time: 11/06/2015 11:40 PM End time: 11/06/2015 11:44 PM  Staffing Anesthesiologist: Leilani AbleHATCHETT, Momon Performed: anesthesiologist   Preanesthetic Checklist Completed: patient identified, surgical consent, pre-op evaluation, timeout performed, IV checked, risks and benefits discussed and monitors and equipment checked  Epidural Patient position: sitting Prep: site prepped and draped and DuraPrep Patient monitoring: continuous pulse ox and blood pressure Approach: midline Location: L3-L4 Injection technique: LOR air  Needle:  Needle type: Tuohy  Needle gauge: 17 G Needle length: 9 cm and 9 Needle insertion depth: 5 cm cm Catheter type: closed end flexible Catheter size: 19 Gauge Catheter at skin depth: 10 cm Test dose: negative and Other  Assessment Sensory level: T9 Events: blood not aspirated, injection not painful, no injection resistance, negative IV test and no paresthesia  Additional Notes Reason for block:procedure for pain

## 2015-11-06 NOTE — H&P (Signed)
LABOR AND DELIVERY ADMISSION HISTORY AND PHYSICAL NOTE  Christine Turner is a 40 y.o. female [id number] with IUP at 373w6d by LMP and early U/S presenting for IOL for oligohydramnios.   She reports positive fetal movement. She denies leakage of fluid or vaginal bleeding.  Prenatal History/Complications: AMA, beta thalassemia trait, previous pregnancy c-section for failure to progress with postpartum infection requiring ABX that caused anemia, TOLAC, oligohydramnios  Past Medical History: Past Medical History:  Diagnosis Date  . Anemia   . Thalassemia trait     Past Surgical History: Past Surgical History:  Procedure Laterality Date  . BREAST BIOPSY    . CESAREAN SECTION    . DILATION AND CURETTAGE OF UTERUS      Obstetrical History: OB History    Gravida Para Term Preterm AB Living   5 1 1   3 2    SAB TAB Ectopic Multiple Live Births   2 1   1 2       Social History: Social History   Social History  . Marital status: Married    Spouse name: N/A  . Number of children: N/A  . Years of education: N/A   Social History Main Topics  . Smoking status: Never Smoker  . Smokeless tobacco: Never Used  . Alcohol use Yes     Comment: occasional  . Drug use: No  . Sexual activity: Yes    Birth control/ protection: None   Other Topics Concern  . Not on file   Social History Narrative  . No narrative on file    Family History: Family History  Problem Relation Age of Onset  . Hypertension Mother   . Diabetes Father   . Hypertension Father     Allergies: Allergies  Allergen Reactions  . Penicillins Anaphylaxis    Prescriptions Prior to Admission  Medication Sig Dispense Refill Last Dose  . aspirin 325 MG tablet Take 325 mg by mouth as needed.   11/05/2015 at Unknown time  . CALCIUM & MAGNESIUM CARBONATES PO Take 1 tablet by mouth daily.   11/06/2015 at Unknown time  . Fluticasone Propionate (FLONASE ALLERGY RELIEF NA) Place 1 spray into the nose as needed.   Past  Month at Unknown time  . Prenatal Vit-Fe Fumarate-FA (PRENATAL VITAMIN PO) Take by mouth.   11/06/2015 at Unknown time  . ranitidine (ZANTAC) 150 MG capsule Take 150 mg by mouth as needed for heartburn. Reported on 07/28/2015   Past Week at Unknown time  . Misc. Devices (BREAST PUMP) MISC Dispense one electric breast pump for patient 1 each 0 Unknown at Unknown time     Review of Systems   All systems reviewed and negative except as stated in HPI  Temperature 98.7 F (37.1 C), temperature source Oral, resp. rate 18, last menstrual period 01/31/2015. General appearance: alert, cooperative and no distress Lungs: no respiratory distress Heart: regular rate Abdomen: soft, non-tender Extremities: No calf swelling or tenderness Presentation: cephalic by cervical exam Fetal monitoring: 140 baseline, moderate variability, + accelerations, no decelerations Uterine activity: q2-664min     Prenatal labs: ABO, Rh: B/Positive/-- (02/01 0000) Antibody: Negative (02/01 0000) Rubella: immune RPR: NON REAC (06/02 1108)  HBsAg: Negative (02/01 0000)  HIV: NONREACTIVE (06/02 1108)  GBS: Negative (08/14 0000)  Genetic screening:  negative Anatomy US: normal  Prenatal Transfer Tool  Maternal Diabetes: No Genetic Screening: Normal Maternal Ultrasounds/Referrals: Normal Fetal Ultrasounds or other Referrals:  None Maternal Substance Abuse:  No Significant Maternal Medications:  None Significant  Maternal Lab Results: Lab values include: Group B Strep negative  No results found for this or any previous visit (from the past 24 hour(s)).  Patient Active Problem List   Diagnosis Date Noted  . Oligohydramnios 11/06/2015  . Low amniotic fluid 10/25/2015  . Supervision of high risk pregnancy, antepartum 07/25/2015  . AMA (advanced maternal age) multigravida 35+ 07/25/2015  . Previous cesarean delivery affecting pregnancy, antepartum 07/25/2015  . Beta thalassemia (HCC) 07/25/2015  . Left ovarian  cyst - simple 2.5 cm 07/25/2015    Assessment: Christine Turner is a 40 y.o. [id number] at [redacted]w[redacted]d here for IOL for oligohydramnios  #Labor: plan for low dose pitocin and foley bulb #Pain: Fentanyl and nitrous oxide #FWB: Category I #ID:  GBS neg #MOF: breast and bottle #MOC:paraguard #Circ:  inpatient  Leland Her, DO PGY-1 9/11/20177:16 PM   OB FELLOW HISTORY AND PHYSICAL ATTESTATION  I have seen and examined this patient; I agree with above documentation in the resident's note.    Christine Mow, DO OB Fellow 11/06/2015, 8:12 PM

## 2015-11-07 ENCOUNTER — Encounter (HOSPITAL_COMMUNITY): Payer: Self-pay

## 2015-11-07 ENCOUNTER — Inpatient Hospital Stay (HOSPITAL_COMMUNITY): Admission: RE | Admit: 2015-11-07 | Payer: PRIVATE HEALTH INSURANCE | Source: Ambulatory Visit

## 2015-11-07 DIAGNOSIS — O34219 Maternal care for unspecified type scar from previous cesarean delivery: Secondary | ICD-10-CM

## 2015-11-07 DIAGNOSIS — Z3A39 39 weeks gestation of pregnancy: Secondary | ICD-10-CM

## 2015-11-07 DIAGNOSIS — O4103X0 Oligohydramnios, third trimester, not applicable or unspecified: Secondary | ICD-10-CM

## 2015-11-07 LAB — CBC
HCT: 29.5 % — ABNORMAL LOW (ref 36.0–46.0)
Hemoglobin: 9.8 g/dL — ABNORMAL LOW (ref 12.0–15.0)
MCH: 25 pg — ABNORMAL LOW (ref 26.0–34.0)
MCHC: 33.2 g/dL (ref 30.0–36.0)
MCV: 75.3 fL — ABNORMAL LOW (ref 78.0–100.0)
Platelets: 96 10*3/uL — ABNORMAL LOW (ref 150–400)
RBC: 3.92 MIL/uL (ref 3.87–5.11)
RDW: 23 % — ABNORMAL HIGH (ref 11.5–15.5)
WBC: 16.7 10*3/uL — ABNORMAL HIGH (ref 4.0–10.5)

## 2015-11-07 LAB — ABO/RH: ABO/RH(D): B POS

## 2015-11-07 LAB — RPR: RPR Ser Ql: NONREACTIVE

## 2015-11-07 MED ORDER — WITCH HAZEL-GLYCERIN EX PADS: 1.0000 "application " | MEDICATED_PAD | CUTANEOUS | Status: DC | PRN

## 2015-11-07 MED ORDER — ACETAMINOPHEN 325 MG PO TABS
650.0000 mg | ORAL_TABLET | ORAL | Status: DC | PRN
  Administered 2015-11-07: 650 mg via ORAL
  Filled 2015-11-07: qty 2

## 2015-11-07 MED ORDER — OXYCODONE HCL 5 MG PO TABS: 10.0000 mg | ORAL_TABLET | ORAL | Status: DC | PRN

## 2015-11-07 MED ORDER — ONDANSETRON HCL 4 MG/2ML IJ SOLN
4.0000 mg | INTRAMUSCULAR | Status: DC | PRN
Start: 2015-11-07 — End: 2015-11-09

## 2015-11-07 MED ORDER — DIBUCAINE 1 % RE OINT: 1.0000 "application " | TOPICAL_OINTMENT | RECTAL | Status: DC | PRN

## 2015-11-07 MED ORDER — MEASLES, MUMPS & RUBELLA VAC ~~LOC~~ INJ: 0.5000 mL | INJECTION | Freq: Once | SUBCUTANEOUS | Status: DC

## 2015-11-07 MED ORDER — SENNOSIDES-DOCUSATE SODIUM 8.6-50 MG PO TABS
2.0000 | ORAL_TABLET | ORAL | Status: DC
  Administered 2015-11-07 – 2015-11-08 (×2): 2 via ORAL
  Filled 2015-11-07 (×2): qty 2

## 2015-11-07 MED ORDER — IBUPROFEN 600 MG PO TABS
600.0000 mg | ORAL_TABLET | Freq: Four times a day (QID) | ORAL | Status: DC
  Administered 2015-11-07 – 2015-11-08 (×2): 600 mg via ORAL
  Filled 2015-11-07 (×2): qty 1

## 2015-11-07 MED ORDER — SIMETHICONE 80 MG PO CHEW: 80.0000 mg | CHEWABLE_TABLET | ORAL | Status: DC | PRN

## 2015-11-07 MED ORDER — BENZOCAINE-MENTHOL 20-0.5 % EX AERO
1.0000 "application " | INHALATION_SPRAY | CUTANEOUS | Status: DC | PRN
  Administered 2015-11-09: 1 via TOPICAL
  Filled 2015-11-07: qty 56

## 2015-11-07 MED ORDER — PRENATAL MULTIVITAMIN CH
1.0000 | ORAL_TABLET | Freq: Every day | ORAL | Status: DC
  Administered 2015-11-08 – 2015-11-09 (×2): 1 via ORAL
  Filled 2015-11-07 (×2): qty 1

## 2015-11-07 MED ORDER — INFLUENZA VAC SPLIT QUAD 0.5 ML IM SUSY
0.5000 mL | PREFILLED_SYRINGE | INTRAMUSCULAR | Status: AC
  Administered 2015-11-08: 0.5 mL via INTRAMUSCULAR
  Filled 2015-11-07: qty 0.5

## 2015-11-07 MED ORDER — COCONUT OIL OIL
1.0000 "application " | TOPICAL_OIL | Status: DC | PRN
  Administered 2015-11-08: 1 via TOPICAL
  Filled 2015-11-07: qty 120

## 2015-11-07 MED ORDER — ONDANSETRON HCL 4 MG PO TABS: 4.0000 mg | ORAL_TABLET | ORAL | Status: DC | PRN

## 2015-11-07 MED ORDER — OXYCODONE HCL 5 MG PO TABS
5.0000 mg | ORAL_TABLET | ORAL | Status: DC | PRN
  Administered 2015-11-07 (×2): 5 mg via ORAL
  Filled 2015-11-07 (×2): qty 1

## 2015-11-07 MED ORDER — TETANUS-DIPHTH-ACELL PERTUSSIS 5-2.5-18.5 LF-MCG/0.5 IM SUSP: 0.5000 mL | Freq: Once | INTRAMUSCULAR | Status: DC

## 2015-11-07 MED ORDER — ZOLPIDEM TARTRATE 5 MG PO TABS: 5.0000 mg | ORAL_TABLET | Freq: Every evening | ORAL | Status: DC | PRN

## 2015-11-07 MED ORDER — DIPHENHYDRAMINE HCL 25 MG PO CAPS: 25.0000 mg | ORAL_CAPSULE | Freq: Four times a day (QID) | ORAL | Status: DC | PRN

## 2015-11-07 NOTE — Progress Notes (Signed)
Dr in to see pt. Physician states that motrin may be given to patient as ordered.Pt medicated with motrin after emptying her bladder. Pt states voiding has helped her cramping.

## 2015-11-07 NOTE — Lactation Note (Signed)
This note was copied from a baby's chart. Lactation Consultation Note  Patient Name: Christine Turner Reason for consult: Initial assessment Baby at 7 hr of life. Mom denies breast or nipple pain. She is worried that baby is not latching correctly and she does not have milk. Mom ebf twins until she went back to work and her supply dropped. She is already using DEBP and drinking Mother's Milk Tea. Discussed baby behavior, feeding frequency, baby belly size, voids, wt loss, breast changes, and nipple care. She stated she can manually express and has spoon in room. Given lactation handouts. Aware of OP services and support group.    Maternal Data Has patient been taught Hand Expression?: Yes Does the patient have breastfeeding experience prior to this delivery?: Yes  Feeding Feeding Type: Breast Fed  LATCH Score/Interventions Latch: Repeated attempts needed to sustain latch, nipple held in mouth throughout feeding, stimulation needed to elicit sucking reflex. Intervention(s): Adjust position;Breast compression  Audible Swallowing: A few with stimulation Intervention(s): Skin to skin;Hand expression;Alternate breast massage  Type of Nipple: Everted at rest and after stimulation  Comfort (Breast/Nipple): Soft / non-tender     Hold (Positioning): Full assist, staff holds infant at breast Intervention(s): Support Pillows;Position options  LATCH Score: 6  Lactation Tools Discussed/Used WIC Program: No   Consult Status Consult Status: Follow-up Date: 11/08/15 Follow-up type: In-patient    Christine Turner Turner, 9:23 PM

## 2015-11-07 NOTE — Anesthesia Postprocedure Evaluation (Signed)
Anesthesia Post Note  Patient: Christine Turner  Procedure(s) Performed: * No procedures listed *  Patient location during evaluation: Mother Baby Anesthesia Type: Epidural Level of consciousness: awake and alert Pain management: pain level not controlled (The platlet count prior to delovery was 95.  THe patient reports that ibuprofen provides the best pain relief but was told she couldn't take it . ) Vital Signs Assessment: post-procedure vital signs reviewed and stable Respiratory status: respiratory function stable Cardiovascular status: stable Postop Assessment: no headache, no backache, epidural receding, patient able to bend at knees, no signs of nausea or vomiting and adequate PO intake Anesthetic complications: no     Last Vitals:  Vitals:   11/07/15 1700 11/07/15 1800  BP: (!) 118/57 (!) 115/59  Pulse: 93 96  Resp: 18 18  Temp: 37.6 C 36.9 C    Last Pain:  Vitals:   11/07/15 1858  TempSrc:   PainSc: 8    Pain Goal: Patients Stated Pain Goal: 2 (11/07/15 1858)               Karleen DolphinFUSSELL,Sylvestre Rathgeber

## 2015-11-07 NOTE — Progress Notes (Signed)
LABOR PROGRESS NOTE  Christine Turner is a 40 y.o. [id number] at 8514w6d  admitted for IOL for oligohydramnios.   Subjective: Patient sleeping comfortably at this time. S/p epidural placement  Objective: BP (!) 96/58   Pulse 87   Temp 98.6 F (37 C) (Oral)   Resp 20   LMP 01/31/2015 (Exact Date)   SpO2 100%  or  Vitals:   11/07/15 0105 11/07/15 0110 11/07/15 0130 11/07/15 0200  BP: 107/63 106/60 (!) 111/57 (!) 96/58  Pulse: 77 76 82 87  Resp:    20  Temp:      TempSrc:      SpO2: 100% 100%      Dilation: 4 Effacement (%): 70, 80 Cervical Position: Middle Station: 0 Presentation: Vertex Exam by:: E. Johnson,RN   Toco: every 2-6 min FHT: baseline rate 145, moderate variability +acel, no decel  Labs: Lab Results  Component Value Date   WBC 12.8 (H) 11/06/2015   HGB 10.9 (L) 11/06/2015   HCT 32.3 (L) 11/06/2015   MCV 75.3 (L) 11/06/2015   PLT 122 (L) 11/06/2015    Patient Active Problem List   Diagnosis Date Noted  . Oligohydramnios 11/06/2015  . Low amniotic fluid 10/25/2015  . Supervision of high risk pregnancy, antepartum 07/25/2015  . AMA (advanced maternal age) multigravida 35+ 07/25/2015  . Previous cesarean delivery affecting pregnancy, antepartum 07/25/2015  . Beta thalassemia (HCC) 07/25/2015  . Left ovarian cyst - simple 2.5 cm 07/25/2015    Assessment / Plan: 40 y.o. [id number] at 2814w6d here for IOL for oligohydramnios. TOLAC.   Labor: s/p FB. Continue low-dose Pitocin  Fetal Wellbeing:  Category I now. Prolonged decel at 1am, which improved with repositioning and IV bolus.  Pain Control:  Well-controlled with epidural  Anticipated MOD:  SVD  Christine PearJulie P Joslyn Ramos, MD 11/07/2015, 2:14 AM

## 2015-11-07 NOTE — Progress Notes (Signed)
Dr Gentry RochJudd notified that epidural catheter was mistakenly pulled prior to CBC results being relayed to MD

## 2015-11-07 NOTE — Progress Notes (Addendum)
Assumed care of pt. Pt comfortable w/o complaints. Denies pain with epidural infusing. IV infusing LR @ 18025ml/hr  and pitocin @ 3mU. Variables and lates noted. Pt on peanut ball. Repositioned to left side and peanut ball replaced btw legs. Will cont to monitor.   [id number]: Lates noted. repositioned pt to right tilt and peanut ball removed. toco and tranducer adjusted. Will cont to monitor  1014: SVE for prolong decel down to 95-100. See flow sheet for nursing interventions. Will cont to monitor  1208: room setup for delivery and instruments, sharps, and laps verified and counted with Leonia Reevesrtiz Margarett, Surgical tech   1250: pt stating starting to feel some rectal pressure. Will cont to monitor  1327: pt stating feeling more rectal pressure. SVE. See flow sheet for details.   1335: MD at bs to evaluate pt. Pt positioned for pushing. Comfortable at this time.   1347: waiting on MD before initiating pushing.   1355: MD at bs. Will start pushing with ctx  1414: cont to push with ctx  1415  Birth baby APGAR 8/9  1522: delivery of placenta  1423: repair of perineum initiated   1405: infant to breast with good latch.  1530: lab at bs for post delivery CBC  Pt eating regular meal and tolerating it well  1612: up to bathroom on a stedi.   1635: in and out done since pt could not void. 725ml drained. Light yellow and clear  1645: [facility]. Reported off to RN Marnette BurgessEllen Branchstrader

## 2015-11-08 MED ORDER — IBUPROFEN 800 MG PO TABS
800.0000 mg | ORAL_TABLET | Freq: Four times a day (QID) | ORAL | Status: DC
  Administered 2015-11-08: 800 mg via ORAL
  Filled 2015-11-08: qty 1

## 2015-11-08 MED ORDER — IBUPROFEN 800 MG PO TABS: 800.0000 mg | ORAL_TABLET | Freq: Three times a day (TID) | ORAL | Status: DC

## 2015-11-08 MED ORDER — IBUPROFEN 800 MG PO TABS
800.0000 mg | ORAL_TABLET | Freq: Four times a day (QID) | ORAL | Status: DC | PRN
  Administered 2015-11-08 – 2015-11-09 (×3): 800 mg via ORAL
  Filled 2015-11-08 (×3): qty 1

## 2015-11-08 NOTE — Lactation Note (Signed)
This note was copied from a baby's chart. Lactation Consultation Note  Patient Name: Christine Turner UEAVW'UToday's Date: 11/08/2015 Reason for consult: Follow-up assessment Baby at 27 hr of life. Mom stated baby was latching well until he was circumcised and now he is sleepy. She feels like her breast are filling but baby will not wake. Reviewed DEBP, given storage bottles and labels. Discussed baby behavior, feeding frequency, baby belly size, voids, wt loss, breast changes, and nipple care. She is aware of lactation services and support group. She will call as needed.    Maternal Data    Feeding    LATCH Score/Interventions                      Lactation Tools Discussed/Used Pump Review: Milk Storage;Setup, frequency, and cleaning   Consult Status Consult Status: Follow-up Date: 11/09/15 Follow-up type: In-patient    Rulon Eisenmengerlizabeth E Azaliah Carrero 11/08/2015, 6:07 PM

## 2015-11-08 NOTE — Progress Notes (Signed)
POSTPARTUM PROGRESS NOTE  Post Partum Day 1  Subjective:  Christine Turner is a 40 y.o. [id number] 815w0d s/p VBAC.  No acute events overnight.  Pt denies problems with ambulating, voiding or po intake.  She denies nausea or vomiting.  Pain is well controlled.  She has had flatus. Lochia Moderate.   Objective: Blood pressure (!) 122/59, pulse 85, temperature 98.6 F (37 C), temperature source Oral, resp. rate 18, last menstrual period 01/31/2015, SpO2 100 %, unknown if currently breastfeeding.  Physical Exam:  General: alert, cooperative and no distress Lochia:normal flow Chest: no respiratory distress Heart:regular rate, distal pulses intact Abdomen: soft, nontender,  Uterine Fundus: firm, appropriately tender DVT Evaluation: No calf swelling or tenderness Extremities: no edema   Recent Labs  11/06/15 1850 11/07/15 1525  HGB 10.9* 9.8*  HCT 32.3* 29.5*    Assessment/Plan:  ASSESSMENT: Christine Turner is a 40 y.o. [id number] 315w0d s/p VBAC. Doing well. Breastfeeding.   Plan for discharge tomorrow   LOS: 2 days   Kandra NicolasJulie P DegeleMD 11/08/2015, 8:56 AM   OB FELLOW POSTPARTUM PROGRESS NOTE ATTESTATION  I have seen and examined this patient and agree with above documentation in the resident's note.   Christine PennaNicholas Dalinda Heidt, MD 10:19 AM

## 2015-11-09 DIAGNOSIS — O34219 Maternal care for unspecified type scar from previous cesarean delivery: Secondary | ICD-10-CM

## 2015-11-09 MED ORDER — IBUPROFEN 800 MG PO TABS: 800.0000 mg | ORAL_TABLET | Freq: Four times a day (QID) | ORAL | 0 refills | Status: DC | PRN

## 2015-11-09 MED ORDER — SENNOSIDES-DOCUSATE SODIUM 8.6-50 MG PO TABS: 2.0000 | ORAL_TABLET | ORAL | 0 refills | Status: DC

## 2015-11-09 NOTE — Lactation Note (Signed)
This note was copied from a baby's chart. Lactation Consultation Note; Experienced BF mom attempting to latch baby when I went into room. Mom needed some assistance- encouraged to hold breast throughout the feeding and to keep the baby close to the breast. Baby latched well and NT getting ready to do hearing screen. Reviewed our phone number, OP appointments and BFSG as resources for support after DC. No questions at present. To call prn  Patient Name: [name] Reason for consult: Follow-up assessment   Maternal Data Formula Feeding for Exclusion: No Has patient been taught Hand Expression?: Yes Does the patient have breastfeeding experience prior to this delivery?: Yes  Feeding Feeding Type: Breast Fed  LATCH Score/Interventions Latch: Grasps breast easily, tongue down, lips flanged, rhythmical sucking.  Audible Swallowing: A few with stimulation  Type of Nipple: Everted at rest and after stimulation  Comfort (Breast/Nipple): Soft / non-tender     Hold (Positioning): Assistance needed to correctly position infant at breast and maintain latch.  LATCH Score: 8  Lactation Tools Discussed/Used     Consult Status Consult Status: Complete    Pamelia HoitWeeks, Marketia Stallsmith D Turner, 9:26 AM

## 2015-11-09 NOTE — Discharge Instructions (Signed)

## 2015-11-09 NOTE — Discharge Summary (Signed)
OB Discharge Summary     Patient Name: Christine Turner DOB: Jul 04, 1975 MRN: [id number]  Date of admission: 11/06/2015 Delivering MD: Reva Bores   Date of discharge: 11/09/2015  Admitting diagnosis: INDUCTION Intrauterine pregnancy: [redacted]w[redacted]d     Secondary diagnosis:  Active Problems:   Oligohydramnios   SVD (spontaneous vaginal delivery)   VBAC, delivered  Additional problems: none     Discharge diagnosis: Term Pregnancy Delivered                                                                                                Post partum procedures:none  Augmentation: AROM and Pitocin  Complications: None  Hospital course:  Induction of Labor With Vaginal Delivery   40 y.o. yo [phone] at [redacted]w[redacted]d was admitted to the hospital 11/06/2015 for induction of labor.  Indication for induction: oligohydramnios and AMA.  Patient had an uncomplicated labor course as follows: Membrane Rupture Time/Date: 5:58 AM ,11/07/2015   Intrapartum Procedures: Episiotomy: None [1]                                         Lacerations:  2nd degree [3];Perineal [11]  Patient had delivery of a Viable infant.  Information for the patient's newborn:  Liseth, Wann [[id number]]  Delivery Method: VBAC   11/07/2015  Details of delivery can be found in separate delivery note.  Patient had a routine postpartum course. Patient is discharged home 11/09/15.   Physical exam Vitals:   11/07/15 2148 11/08/15 0522 11/08/15 1735 11/09/15 0548  BP: (!) 115/51 (!) 122/59 104/60 (!) 98/51  Pulse: 92 85 96 88  Resp: 20 18 18 18   Temp: 98.7 F (37.1 C) 98.6 F (37 C) 98.9 F (37.2 C) 98.7 F (37.1 C)  TempSrc: Oral Oral    SpO2:       General: alert, cooperative and no distress Lochia: appropriate Uterine Fundus: firm DVT Evaluation: No evidence of DVT seen on physical exam. Negative Homan's sign. Labs: Lab Results  Component Value Date   WBC 16.7 (H) 11/07/2015   HGB 9.8 (L) 11/07/2015   HCT 29.5 (L)  11/07/2015   MCV 75.3 (L) 11/07/2015   PLT 96 (L) 11/07/2015   No flowsheet data found.  Discharge instruction: per After Visit Summary and "Baby and Me Booklet".  After visit meds:    Medication List    STOP taking these medications   aspirin 325 MG tablet     TAKE these medications   acetaminophen 325 MG tablet Commonly known as:  TYLENOL Take 1,300 mg by mouth every 6 (six) hours as needed for mild pain.   Breast Pump Misc Dispense one electric breast pump for patient   CALCIUM & MAGNESIUM CARBONATES PO Take 1 tablet by mouth daily.   FLONASE ALLERGY RELIEF NA Place 1 spray into the nose as needed.   ibuprofen 800 MG tablet Commonly known as:  ADVIL,MOTRIN Take 1 tablet (800 mg total) by mouth every 6 (six) hours as needed for  cramping.   prenatal multivitamin Tabs tablet Take 1 tablet by mouth daily at 12 noon.   ranitidine 150 MG capsule Commonly known as:  ZANTAC Take 150 mg by mouth as needed for heartburn. Reported on 07/28/2015   senna-docusate 8.6-50 MG tablet Commonly known as:  Senokot-S Take 2 tablets by mouth daily. Start taking on:  11/10/2015       Diet: routine diet  Activity: Advance as tolerated. Pelvic rest for 6 weeks.   Outpatient follow up:6 weeks Follow up Appt: Future Appointments Date Time Provider Department Center  12/12/2015 1:45 PM Reva Boresanya S Pratt, MD [facility]   Follow up Visit: Follow-up Information    [facility] Follow up in 6 week(s).   Specialty:  Obstetrics and Gynecology Why:  postpartum visit Contact information: [address] [place] [id number] [phone]         Postpartum contraception: IUD Paragard  Newborn Data: Live born female  Birth Weight: 6 lb 14 oz (3118 g) APGAR: 8, 9  Baby Feeding: Bottle and Breast Disposition:home with mother   11/09/2015 Leland HerElsia J Yoo, DO PGY-1  OB FELLOW DISCHARGE ATTESTATION  I have seen and  examined this patient and agree with above documentation in the resident's note.   Ernestina PennaNicholas Rosario Kushner, MD 1:58 PM

## 2015-12-12 ENCOUNTER — Ambulatory Visit (INDEPENDENT_AMBULATORY_CARE_PROVIDER_SITE_OTHER): Payer: PRIVATE HEALTH INSURANCE | Admitting: Family Medicine

## 2015-12-12 ENCOUNTER — Encounter: Payer: Self-pay | Admitting: Family Medicine

## 2015-12-12 VITALS — BP 101/66 | HR 80 | Resp 18 | Wt 136.0 lb

## 2015-12-12 DIAGNOSIS — Z30430 Encounter for insertion of intrauterine contraceptive device: Secondary | ICD-10-CM

## 2015-12-12 DIAGNOSIS — Z3202 Encounter for pregnancy test, result negative: Secondary | ICD-10-CM | POA: Diagnosis not present

## 2015-12-12 DIAGNOSIS — G44221 Chronic tension-type headache, intractable: Secondary | ICD-10-CM

## 2015-12-12 LAB — POCT URINE PREGNANCY: Preg Test, Ur: NEGATIVE

## 2015-12-12 MED ORDER — PARAGARD INTRAUTERINE COPPER IU IUD
INTRAUTERINE_SYSTEM | Freq: Once | INTRAUTERINE | Status: AC
  Administered 2015-12-12: 12:00:00 via INTRAUTERINE

## 2015-12-12 NOTE — Progress Notes (Signed)
   Subjective:     Christine Turner is a 40 y.o. female who presents for a postpartum visit. She is 5 weeks postpartum following a spontaneous vaginal delivery. I have fully reviewed the prenatal and intrapartum course. The delivery was at 39 gestational weeks. Outcome: vaginal birth after cesarean (VBAC). Anesthesia: epidural. Postpartum course has been notable for headaches, requiring medication which is not helping.. Baby's course has been normal. Baby is feeding by breast. Bleeding no bleeding. Bowel function is normal. Bladder function is normal. Patient is not sexually active. Contraception method is none. Postpartum depression screening: negative.  The following portions of the patient's history were reviewed and updated as appropriate: allergies, current medications, past family history, past medical history, past social history, past surgical history and problem list.  Review of Systems Pertinent items noted in HPI and remainder of comprehensive ROS otherwise negative.   Objective:    LMP 01/31/2015 (Exact Date)   General:  alert, cooperative and appears stated age  Lungs: normal effort  Heart:  regular rate and rhythm  Abdomen: soft, non-tender; bowel sounds normal; no masses,  no organomegaly      Procedure:  Patient identified, informed consent performed, signed copy in chart, time out was performed.  Urine pregnancy test negative.  Speculum placed in the vagina.  Cervix visualized.  Cleaned with Betadine x 2.  Grasped anteriourly with a single tooth tenaculum.  Uterus sounded to 8 cm. Paraguard IUD placed per manufacturer's recommendations.  Strings trimmed to 3 cm.   Patient given post procedure instructions and Paraguard care card with expiration date.  Patient is asked to check IUD strings periodically and follow up in 4-6 weeks for IUD check.  Assessment:     GYN postpartum exam. Pap smear not done at today's visit.   Plan:    1. Contraception: IUD 2. Pap due next 1-2  years 3. Rest, stay hydrated, avoid stress, NSAIDS as needed for headache. 4. Follow up in: 4 weeks for IUD check or as needed.

## 2015-12-12 NOTE — Patient Instructions (Signed)
Intrauterine Device Information An intrauterine device (IUD) is inserted into your uterus to prevent pregnancy. There are two types of IUDs available:   Copper IUD--This type of IUD is wrapped in copper wire and is placed inside the uterus. Copper makes the uterus and fallopian tubes produce a fluid that kills sperm. The copper IUD can stay in place for 10 years.  Hormone IUD--This type of IUD contains the hormone progestin (synthetic progesterone). The hormone thickens the cervical mucus and prevents sperm from entering the uterus. It also thins the uterine lining to prevent implantation of a fertilized egg. The hormone can weaken or kill the sperm that get into the uterus. One type of hormone IUD can stay in place for 5 years, and another type can stay in place for 3 years. Your health care provider will make sure you are a good candidate for a contraceptive IUD. Discuss with your health care provider the possible side effects.  ADVANTAGES OF AN INTRAUTERINE DEVICE  IUDs are highly effective, reversible, long acting, and low maintenance.   There are no estrogen-related side effects.   An IUD can be used when breastfeeding.   IUDs are not associated with weight gain.   The copper IUD works immediately after insertion.   The hormone IUD works right away if inserted within 7 days of your period starting. You will need to use a backup method of birth control for 7 days if the hormone IUD is inserted at any other time in your cycle.  The copper IUD does not interfere with your female hormones.   The hormone IUD can make heavy menstrual periods lighter and decrease cramping.   The hormone IUD can be used for 3 or 5 years.   The copper IUD can be used for 10 years. DISADVANTAGES OF AN INTRAUTERINE DEVICE  The hormone IUD can be associated with irregular bleeding patterns.   The copper IUD can make your menstrual flow heavier and more painful.   You may experience cramping and  vaginal bleeding after insertion.    This information is not intended to replace advice given to you by your health care provider. Make sure you discuss any questions you have with your health care provider.   Document Released: 01/16/2004 Document Revised: 10/14/2012 Document Reviewed: 08/02/2012 Elsevier Interactive Patient Education 2016 Elsevier Inc.  

## 2015-12-18 ENCOUNTER — Encounter: Payer: Self-pay | Admitting: *Deleted

## 2015-12-22 ENCOUNTER — Encounter: Payer: Self-pay | Admitting: *Deleted

## 2016-02-06 ENCOUNTER — Encounter: Payer: Self-pay | Admitting: Obstetrics & Gynecology

## 2016-02-06 ENCOUNTER — Ambulatory Visit (INDEPENDENT_AMBULATORY_CARE_PROVIDER_SITE_OTHER): Payer: BLUE CROSS/BLUE SHIELD | Admitting: Obstetrics & Gynecology

## 2016-02-06 VITALS — BP 99/62 | HR 84 | Resp 18 | Wt 133.0 lb

## 2016-02-06 DIAGNOSIS — Z30431 Encounter for routine checking of intrauterine contraceptive device: Secondary | ICD-10-CM

## 2016-02-06 DIAGNOSIS — N760 Acute vaginitis: Secondary | ICD-10-CM

## 2016-02-06 DIAGNOSIS — F329 Major depressive disorder, single episode, unspecified: Secondary | ICD-10-CM

## 2016-02-06 DIAGNOSIS — B9689 Other specified bacterial agents as the cause of diseases classified elsewhere: Secondary | ICD-10-CM

## 2016-02-06 DIAGNOSIS — N898 Other specified noninflammatory disorders of vagina: Secondary | ICD-10-CM

## 2016-02-06 NOTE — Progress Notes (Signed)
     GYNECOLOGY OFFICE PROGRESS NOTE  History:  40 y.o. [id number] here today for today for IUD string check; Paragard IUD was placed  12/12/15. No complaints about the IUD, no concerning side effects. Just concerned about foul-smelling vaginal discharge, wants this evaluated.  Patient reports being very depressed, scored 21 on depression scale Inocente Salles([place]).  Maternal grandfather just died, and she also feels overwhelmed with new infant and other twin boys.  Very tearful. Denies any SI and HI, does not want medications. Just wants to talk to someone for now.   The following portions of the patient's history were reviewed and updated as appropriate: allergies, current medications, past family history, past medical history, past social history, past surgical history and problem list. [place] pap [place] in [place] in 2017.  Review of Systems:  Pertinent items are noted in HPI.   Objective:  Physical Exam Blood pressure 99/62, pulse 84, resp. rate 18, weight 133 lb (60.3 kg), currently breastfeeding. CONSTITUTIONAL: Well-developed, well-nourished female in no acute distress.  HENT:  Normocephalic, atraumatic. External right and left ear normal. Oropharynx is clear and moist EYES: Conjunctivae and EOM are normal. Pupils are equal, round, and reactive to light. No scleral icterus.  NECK: Normal range of motion, supple, no masses CARDIOVASCULAR: Normal heart rate noted RESPIRATORY: Effort and breath sounds normal, no problems with respiration noted ABDOMEN: Soft, no distention noted.   PELVIC: Normal appearing external genitalia; normal appearing vaginal mucosa and cervix. Brown discharge seen, sample taken.  IUD strings visualized, about 3 cm in length outside cervix.   Assessment & Plan:  1. Reactive depression - Ambulatory Referral to Integrated Behavioral Health done today  2. Vaginal discharge Discharge is likely normal given recent IUD insertion. Wet prep ordered, will follow up  results and manage accordingly.  3. IUD check up Normal IUD check.    Patient to keep IUD in place for five years; can come in for removal if she desires pregnancy within the next five years. Routine preventative health maintenance measures emphasized.   Total face-to-face time with patient: 25 minutes. Over 50% of encounter was spent on counseling and coordination of care.   Jaynie CollinsUGONNA  ANYANWU, MD, FACOG Attending Obstetrician & Gynecologist, [facility]

## 2016-02-06 NOTE — Patient Instructions (Signed)
Return to clinic for any scheduled appointments or for any gynecologic concerns as needed.   

## 2016-02-07 LAB — WET PREP, GENITAL
Trich, Wet Prep: NONE SEEN
Yeast Wet Prep HPF POC: NONE SEEN

## 2016-02-08 ENCOUNTER — Ambulatory Visit: Payer: PRIVATE HEALTH INSURANCE | Admitting: Family Medicine

## 2016-02-08 ENCOUNTER — Encounter: Payer: Self-pay | Admitting: *Deleted

## 2016-02-12 MED ORDER — METRONIDAZOLE 500 MG PO TABS: 500.0000 mg | ORAL_TABLET | Freq: Two times a day (BID) | ORAL | 0 refills | Status: DC

## 2016-02-12 NOTE — Addendum Note (Signed)
Addended by: Jaynie CollinsANYANWU, UGONNA A on: 02/12/2016 01:24 PM   Modules accepted: Orders

## 2016-02-13 ENCOUNTER — Ambulatory Visit (INDEPENDENT_AMBULATORY_CARE_PROVIDER_SITE_OTHER): Payer: BLUE CROSS/BLUE SHIELD | Admitting: Clinical

## 2016-02-13 DIAGNOSIS — F329 Major depressive disorder, single episode, unspecified: Secondary | ICD-10-CM

## 2016-02-13 DIAGNOSIS — F4323 Adjustment disorder with mixed anxiety and depressed mood: Secondary | ICD-10-CM | POA: Diagnosis not present

## 2016-02-13 NOTE — BH Specialist Note (Signed)
Session Start time: 11:45   End Time: 12:45 Total Time:  60 minutes Type of Service: Behavioral Health - Individual/Family Interpreter: No.   Interpreter Name & Language: n/a # [facility] July 2017-June 2018: 1st  SUBJECTIVE: Jenel LucksCarla Thurgood is a 40 y.o. female  Pt. was referred by Dr. Macon LargeAnyanwu for:  anxiety and depression. Pt. reports the following symptoms/concerns: Pt states that she is feeling overwhelmed caring for 77mo and 41yo twins, along with grieving loss of father(about a month prior to last birth), [place], and working on her own business.Pt says primary symptoms are becoming easily irritated and lack of quality sleep. Duration of problem:  Over two months Severity: moderate Previous treatment: none  OBJECTIVE: Mood: Anxious & Affect: Tearful Risk of harm to self or others: No known risk of harm to self or others Assessments administered: none today  LIFE CONTEXT:  Family & Social: Lives with mother, 3 children(8377mo, 2yo twins); husband and many friends in [place]  School/ Work: Self-employed Self-Care: Sleep issues Life changes: Within past 6 months: move to [place], father passed away, childbirth What is important to pt/family (values): Overall wellbeing and family/children  GOALS ADDRESSED:  -Reduce symptoms of anxiety and depression  INTERVENTIONS: Solution Focused and Meditation: CALM relaxation breathing exercise   ASSESSMENT:  Pt currently experiencing Adjustment disorder with mixed anxious and depressed mood.  Pt may benefit from psychoeducation and brief therapeutic intervention regarding coping with symptoms of anxiety and depression.   PLAN: 1. F/U with behavioral health clinician: One month, or earlier, as needed 2. Behavioral Health meds: none 3. Behavioral recommendations:  -Practice daily CALM relaxation breathing exercise, with multivitamin -Attend Baby and Me class on Thursdays at 11am, at [facility] Education classrooms -Consider  adding "Worry Hour" strategy to daily schedule, to prioritize life stressors -Read educational material regarding coping with symptoms of anxiety and depression 4. Referral: Brief Counseling/Psychotherapy, Publishing rights managerCommunity Resource and Psychoeducation 5. From scale of 1-10, how likely are you to follow plan: 9  Rae LipsJamie C Tamora Huneke LCSWA Behavioral Health Clinician  Warmhandoff: no

## 2016-03-01 ENCOUNTER — Ambulatory Visit: Payer: Self-pay

## 2016-03-04 ENCOUNTER — Ambulatory Visit: Payer: Self-pay

## 2016-03-06 ENCOUNTER — Telehealth: Payer: Self-pay

## 2016-03-06 NOTE — Telephone Encounter (Signed)
Received paper work from patient employer requesting we fill out Psychiatric form for L[employer]. Per AD we can not fill out this paperwork at this time. [facility] but are not authorize to fill out these type of forms. Patient verbalizes understanding at this time. She would like to set up an appointment with Asher MuirJamie. I have referred her to the appointment desk to schedule an appointment.

## 2016-03-21 ENCOUNTER — Telehealth: Payer: Self-pay | Admitting: *Deleted

## 2016-03-21 DIAGNOSIS — F4323 Adjustment disorder with mixed anxiety and depressed mood: Secondary | ICD-10-CM

## 2016-03-21 MED ORDER — HYDROXYZINE PAMOATE 25 MG PO CAPS: ORAL_CAPSULE | ORAL | 4 refills | Status: DC

## 2016-03-21 NOTE — Telephone Encounter (Signed)
Called in Vistaril to pharmacy per Dr Macon LargeAnyanwu order and recommended referral to psychiatry for further evaluation.  Will have her follow-up with Asher MuirJamie, [facility] on 03-28-16 at 10:00.

## 2016-03-21 NOTE — Telephone Encounter (Signed)
-----   Message from Lindell SparHeather L Bacon, [place] sent at 03/19/2016  4:56 PM EST ----- Regarding: Rx request  Contact: [phone] Pt would like to see if you can get her something called in for not sleeping and anxiety.

## 2016-03-28 ENCOUNTER — Ambulatory Visit (INDEPENDENT_AMBULATORY_CARE_PROVIDER_SITE_OTHER): Payer: BLUE CROSS/BLUE SHIELD | Admitting: Clinical

## 2016-03-28 DIAGNOSIS — F4323 Adjustment disorder with mixed anxiety and depressed mood: Secondary | ICD-10-CM | POA: Diagnosis not present

## 2016-03-28 NOTE — BH Specialist Note (Signed)
Session Start time: 10:45   End Time: 11:45 Total Time:  60 minutes Type of Service: Behavioral Health - Individual/Family Interpreter: No.   Interpreter Name & Language: n/a # [facility]-June 2018[facility]  SUBJECTIVE: Christine LucksCarla Turner is a 41 y.o. female  Pt. was referred by Dr Macon LargeAnyanwu for:  anxiety and depression. Pt. reports the following symptoms/concerns: Pt states that her primary concerns continue to be irritability and lack of quality sleep,stemming from excessive worry. Pt copes best when she is able to sleep uninterrupted, and using Calm app once helped that day. Duration of problem:  Over three months Severity: moderately severe Previous treatment: none  OBJECTIVE: Mood: Anxious & Affect: Appropriate Risk of harm to self or others: No known risk of harm to self or others Assessments administered: PHQ9: 16/ GAD7: 16  LIFE CONTEXT:  Family & Social: Lives with mother, 3 children(9218mo, 2yo twins), husband and friends in [place] School/ Work: Self-employed Self-Care: Sleep issues  Life changes: Within last 7 months: move to [place], father passed away, childbirth What is important to pt/family (values): Overall wellbeing and family/children16  GOALS ADDRESSED:  -Reduce symptoms of anxiety and depression  INTERVENTIONS: Solution Focused   ASSESSMENT:  Pt currently experiencing Adjustment disorder with mixed anxious and depressed mood.  Pt may benefit from brief thereapeutic interventions regarding continued coping with symptoms of anxiety and depression.   PLAN: 1. F/U with behavioral health clinician: One month, or as needed 2. Behavioral Health meds: Vistaril 3. Behavioral recommendations:  -Discussion with childcare provider concerning extra hours -Two hours daily, weekdays, at local coffee shop, reading, writing, and contemplating business planning and ideas; consider using Calm app during this time -Twenty minute nap, daily, while childcare provider is caring for  children 4. Referral: Brief Counseling/Psychotherapy 5. From scale of 1-10, how likely are you to follow plan: 9 [facility]oc-Behavioral Health Clinician  Behavioral Health Clinician  Warmhandoff: no  Depression screen [facility] 2/[facility] 03/28/2016 03/28/2016  Decreased Interest 1 1  Down, Depressed, Hopeless 2 2  PHQ - 2 Score 3 3  Altered sleeping 3 3  Tired, decreased energy 3 -  Change in appetite 3 -  Feeling bad or failure about yourself  2 -  Trouble concentrating 0 -  Moving slowly or fidgety/restless 2 -  Suicidal thoughts 0 -  PHQ-9 Score 16 6   GAD 7 : Generalized Anxiety Score 03/28/2016  Nervous, Anxious, on Edge 2  Control/stop worrying 3  Worry too much - different things 3  Trouble relaxing 2  Restless 2  Easily annoyed or irritable 2  Afraid - awful might happen 2  Total GAD 7 Score 16

## 2016-04-05 ENCOUNTER — Ambulatory Visit (INDEPENDENT_AMBULATORY_CARE_PROVIDER_SITE_OTHER): Payer: PRIVATE HEALTH INSURANCE | Admitting: Obstetrics & Gynecology

## 2016-04-05 DIAGNOSIS — M6208 Separation of muscle (nontraumatic), other site: Secondary | ICD-10-CM | POA: Diagnosis not present

## 2016-04-05 NOTE — Progress Notes (Signed)
Subjective:     Patient ID: Christine Turner, female   DOB: [birth year], 41 y.o.   MRN: [id number] Cc: bulging mid abdomen [id number] No LMP recorded. 5 month postpartum. She notes bulging in low mid abdomen with straining. Past Surgical History:  Procedure Laterality Date  . BREAST BIOPSY    . CESAREAN SECTION    . DILATION AND CURETTAGE OF UTERUS     Past Medical History:  Diagnosis Date  . Anemia   . Thalassemia trait    Allergies  Allergen Reactions  . Almond (Diagnostic) Anaphylaxis and Swelling  . Fish Allergy Anaphylaxis  . Penicillins Hives and Swelling    Has patient had a PCN reaction causing immediate rash, facial/tongue/throat swelling, SOB or lightheadedness with hypotension: Unknown Has patient had a PCN reaction causing severe rash involving mucus membranes or skin necrosis: No Has patient had a PCN reaction that required hospitalization No Has patient had a PCN reaction occurring within the last 10 years: No If all of the above answers are "NO", then may proceed with Cephalosporin use.   Marland Kitchen. Carrot [Daucus Carota] Itching and Swelling    Swelling and itching is of the throat.       Review of Systems  Gastrointestinal: Positive for abdominal distention. Negative for nausea and vomiting.  Genitourinary: Negative for pelvic pain.       Objective:   Physical Exam  Constitutional: She appears well-developed. She appears distressed.  Pulmonary/Chest: Effort normal.  Abdominal: Soft. There is no tenderness.  Diastasis recti noted with central low abdominal bulge possible ventral hernia   Vitals:   04/05/16 1053  BP: 95/64  Pulse: 85  Resp: 18       Assessment:     Patient Active Problem List   Diagnosis Date Noted  . Diastasis of rectus abdominis 04/05/2016  . Beta thalassemia (HCC) 07/25/2015  . Left ovarian cyst - simple 2.5 cm 07/25/2015  Possible ventral hernia     Plan:     General surgery consult requested  Adam PhenixJames G Ila Landowski,  MD 04/05/2016

## 2016-04-05 NOTE — Patient Instructions (Signed)
Ventral Hernia A ventral hernia is a bulge of tissue from inside the abdomen that pushes through a weak area of the muscles that form the front wall of the abdomen. The tissues inside the abdomen are inside a sac (peritoneum). These tissues include the small intestine, large intestine, and the fatty tissue that covers the intestines (omentum). Sometimes, the bulge that forms a hernia contains intestines. Other hernias contain only fat. Ventral hernias do not go away without surgical treatment. There are several types of ventral hernias. You may have:  A hernia at an incision site from previous abdominal surgery (incisional hernia).  A hernia just above the belly button (epigastric hernia), or at the belly button (umbilical hernia). These types of hernias can develop from heavy lifting or straining.  A hernia that comes and goes (reducible hernia). It may be visible only when you lift or strain. This type of hernia can be pushed back into the abdomen (reduced).  A hernia that traps abdominal tissue inside the hernia (incarcerated hernia). This type of hernia does not reduce.  A hernia that cuts off blood flow to the tissues inside the hernia (strangulated hernia). The tissues can start to die if this happens. This is a very painful bulge that cannot be reduced. A strangulated hernia is a medical emergency. What are the causes? This condition is caused by abdominal tissue putting pressure on an area of weakness in the abdominal muscles. What increases the risk? The following factors may make you more likely to develop this condition:  Being female.  Being 60 or older.  Being overweight or obese.  Having had previous abdominal surgery, especially if there was an infection after surgery.  Having had an injury to the abdominal wall.  Having had several pregnancies.  Having a buildup of fluid inside the abdomen (ascites). What are the signs or symptoms? The only symptom of a ventral hernia  may be a painless bulge in the abdomen. A reducible hernia may be visible only when you strain, cough, or lift. Other symptoms may include:  Dull pain.  A feeling of pressure. Signs and symptoms of a strangulated hernia may include:  Increasing pain.  Nausea and vomiting.  Pain when pressing on the hernia.  The skin over the hernia turning red or purple.  Constipation.  Blood in the stool (feces). How is this diagnosed? This condition may be diagnosed based on:  Your symptoms.  Your medical history.  A physical exam. You may be asked to cough or strain while standing. These actions increase the pressure inside your abdomen and force the hernia through the opening in your muscles. Your health care provider may try to reduce the hernia by pressing on it.  Imaging studies, such as an ultrasound or CT scan. How is this treated? This condition is treated with surgery. If you have a strangulated hernia, surgery is done as soon as possible. If your hernia is small and not incarcerated, you may be asked to lose some weight before surgery. Follow these instructions at home:  Follow instructions from your health care provider about eating or drinking restrictions.  If you are overweight, your health care provider may recommend that you increase your activity level and eat a healthier diet.  Do not lift anything that is heavier than 10 lb (4.5 kg).  Return to your normal activities as told by your health care provider. Ask your health care provider what activities are safe for you. You may need to avoid activities that   increase pressure on your hernia.  Take over-the-counter and prescription medicines only as told by your health care provider.  Keep all follow-up visits as told by your health care provider. This is important. Contact a health care provider if:  Your hernia gets larger.  Your hernia becomes painful. Get help right away if:  Your hernia becomes increasingly  painful.  You have pain along with any of the following:  Changes in skin color in the area of the hernia.  Nausea.  Vomiting.  Fever. Summary  A ventral hernia is a bulge of tissue from inside the abdomen that pushes through a weak area of the muscles that form the front wall of the abdomen.  This condition is treated with surgery, which may be urgent depending on your hernia.  Do not lift anything that is heavier than 10 lb (4.5 kg), and follow activity instructions from your health care provider. This information is not intended to replace advice given to you by your health care provider. Make sure you discuss any questions you have with your health care provider. Document Released: 01/29/2012 Document Revised: 09/29/2015 Document Reviewed: 09/29/2015 Elsevier Interactive Patient Education  2017 [employer]  

## 2016-04-12 ENCOUNTER — Other Ambulatory Visit: Payer: Self-pay | Admitting: Obstetrics & Gynecology

## 2016-04-12 DIAGNOSIS — M6208 Separation of muscle (nontraumatic), other site: Secondary | ICD-10-CM

## 2016-05-20 ENCOUNTER — Ambulatory Visit (HOSPITAL_COMMUNITY): Payer: BLUE CROSS/BLUE SHIELD

## 2017-12-08 ENCOUNTER — Ambulatory Visit: Payer: Self-pay | Admitting: Internal Medicine

## 2017-12-11 ENCOUNTER — Ambulatory Visit: Payer: PRIVATE HEALTH INSURANCE | Admitting: Internal Medicine

## 2017-12-12 ENCOUNTER — Ambulatory Visit (INDEPENDENT_AMBULATORY_CARE_PROVIDER_SITE_OTHER): Payer: PRIVATE HEALTH INSURANCE | Admitting: Internal Medicine

## 2017-12-12 ENCOUNTER — Encounter: Payer: Self-pay | Admitting: Internal Medicine

## 2017-12-12 ENCOUNTER — Ambulatory Visit (INDEPENDENT_AMBULATORY_CARE_PROVIDER_SITE_OTHER)
Admission: RE | Admit: 2017-12-12 | Discharge: 2017-12-12 | Disposition: A | Discharge status: Home / Self Care | Payer: PRIVATE HEALTH INSURANCE | Source: Ambulatory Visit | Attending: Internal Medicine | Admitting: Internal Medicine

## 2017-12-12 VITALS — BP 102/60 | HR 80 | Temp 98.7°F | Wt 129.0 lb

## 2017-12-12 DIAGNOSIS — D561 Beta thalassemia: Secondary | ICD-10-CM | POA: Diagnosis not present

## 2017-12-12 DIAGNOSIS — D509 Iron deficiency anemia, unspecified: Secondary | ICD-10-CM

## 2017-12-12 DIAGNOSIS — M79675 Pain in left toe(s): Secondary | ICD-10-CM | POA: Diagnosis not present

## 2017-12-12 DIAGNOSIS — M7989 Other specified soft tissue disorders: Secondary | ICD-10-CM

## 2017-12-12 NOTE — Patient Instructions (Signed)
RICE for Routine Care of Injuries Many injuries can be cared for using rest, ice, compression, and elevation (RICE therapy). Using RICE therapy can help to lessen pain and swelling. It can help your body to heal. Rest Reduce your normal activities and avoid using the injured part of your body. You can go back to your normal activities when you feel okay and your doctor says it is okay. Ice Do not put ice on your bare skin.  Put ice in a plastic bag.  Place a towel between your skin and the bag.  Leave the ice on for 20 minutes, 2-3 times a day.  Do this for as long as told by your doctor. Compression Compression means putting pressure on the injured area. This can be done with an elastic bandage. If an elastic bandage has been applied:  Remove and reapply the bandage every 3-4 hours or as told by your doctor.  Make sure the bandage is not wrapped too tight. Wrap the bandage more loosely if part of your body beyond the bandage is blue, swollen, cold, painful, or loses feeling (numb).  See your doctor if the bandage seems to make your problems worse.  Elevation Elevation means keeping the injured area raised. Raise the injured area above your heart or the center of your chest if you can. When should I get help? You should get help if:  You keep having pain and swelling.  Your symptoms get worse.  Get help right away if: You should get help right away if:  You have sudden bad pain at or below the area of your injury.  You have redness or more swelling around your injury.  You have tingling or numbness at or below the injury that does not go away when you take off the bandage.  This information is not intended to replace advice given to you by your health care provider. Make sure you discuss any questions you have with your health care provider. Document Released: 07/31/2007 Document Revised: 01/09/2016 Document Reviewed: 01/19/2014 Elsevier Interactive Patient Education  2017  [employer] Inc[employer]  

## 2017-12-12 NOTE — Assessment & Plan Note (Signed)
No complications Will monitor

## 2017-12-12 NOTE — Progress Notes (Addendum)
HPI  Pt presents to the clinic today to establish care and for management of the conditions listed below. She is transferring care from.   Anemia: Her last H/H in Epic was 9.8/29.5. She is taking a Prenatal Vitamin containing iron. She has not noticed any s/s of bleeding. She denies fatigue, cold sensation, or shortness of breath.  She also reports right foot pain. She reports this started 2-3 nights ago, while getting her kids ready for bed. Most of the pain is in the 2nd toe. She describes the pain as sore and achy but can be sharp and stabbing with weight bearing. She reports associated swelling and numbness. She denies bruising or tingling. She reports prior history of bilateral foot surgery 15 years ago to shorten her toes. She has tried Ibuprofen and elevation with minimal relief.   Flu: 11/2016 Tetanus: 07/2015 Pap Smear: 10/2016, abnormal Mammogram: ? 2014 in [place] Vision Screening: annually Dentist: biannually  Past Medical History:  Diagnosis Date  . Anemia   . Thalassemia trait     Current Outpatient Medications  Medication Sig Dispense Refill  . Misc. Devices (BREAST PUMP) MISC Dispense one electric breast pump for patient 1 each 0  . Prenatal Vit-Fe Fumarate-FA (PRENATAL MULTIVITAMIN) TABS tablet Take 1 tablet by mouth daily at 12 noon.     No current facility-administered medications for this visit.     Allergies  Allergen Reactions  . Almond (Diagnostic) Anaphylaxis and Swelling  . Fish Allergy Anaphylaxis  . Penicillins Hives and Swelling    Has patient had a PCN reaction causing immediate rash, facial/tongue/throat swelling, SOB or lightheadedness with hypotension: Unknown Has patient had a PCN reaction causing severe rash involving mucus membranes or skin necrosis: No Has patient had a PCN reaction that required hospitalization No Has patient had a PCN reaction occurring within the last 10 years: No If all of the above answers are "NO", then may proceed with  Cephalosporin use.   Marland Kitchen Carrot [Daucus Carota] Itching and Swelling    Swelling and itching is of the throat.    Family History  Problem Relation Age of Onset  . Hypertension Mother   . Diabetes Father   . Hypertension Father     Social History   Socioeconomic History  . Marital status: Married    Spouse name: Not on file  . Number of children: Not on file  . Years of education: Not on file  . Highest education level: Not on file  Occupational History  . Not on file  Social Needs  . Financial resource strain: Not on file  . Food insecurity:    Worry: Not on file    Inability: Not on file  . Transportation needs:    Medical: Not on file    Non-medical: Not on file  Tobacco Use  . Smoking status: Never Smoker  . Smokeless tobacco: Never Used  Substance and Sexual Activity  . Alcohol use: Yes    Comment: occasional  . Drug use: No  . Sexual activity: Not Currently    Birth control/protection: IUD  Lifestyle  . Physical activity:    Days per week: Not on file    Minutes per session: Not on file  . Stress: Not on file  Relationships  . Social connections:    Talks on phone: Not on file    Gets together: Not on file    Attends religious service: Not on file    Active member of club or organization: Not on  file    Attends meetings of clubs or organizations: Not on file    Relationship status: Not on file  . Intimate partner violence:    Fear of current or ex partner: Not on file    Emotionally abused: Not on file    Physically abused: Not on file    Forced sexual activity: Not on file  Other Topics Concern  . Not on file  Social History Narrative  . Not on file    ROS:  Constitutional: Pt reports fatigue. Denies fever, malaise, fatigue, headache or abrupt weight changes.  HEENT: Denies eye pain, eye redness, ear pain, ringing in the ears, wax buildup, runny nose, nasal congestion, bloody nose, or sore throat. Respiratory: Denies difficulty breathing,  shortness of breath, cough or sputum production.   Cardiovascular: Denies chest pain, chest tightness, palpitations or swelling in the hands or feet.  Gastrointestinal: Denies abdominal pain, bloating, constipation, diarrhea or blood in the stool.  GU: Denies frequency, urgency, pain with urination, blood in urine, odor or discharge. Musculoskeletal: Pt reports 2nd toe pain and swelling, left foot. Denies decrease in range of motion, difficulty with gait, muscle pain or.  Skin: Denies redness, rashes, lesions or ulcercations.  Neurological: Denies dizziness, difficulty with memory, difficulty with speech or problems with balance and coordination.  Psych: Denies anxiety, depression, SI/HI.  No other specific complaints in a complete review of systems (except as listed in HPI above).  PE:  BP 102/60   Pulse 80   Temp 98.7 F (37.1 C) (Oral)   Wt 129 lb (58.5 kg)   SpO2 98%   Breastfeeding? Yes   BMI 22.14 kg/m   Wt Readings from Last 3 Encounters:  12/12/17 129 lb (58.5 kg)  04/05/16 136 lb (61.7 kg)  02/06/16 133 lb (60.3 kg)    General: Appears her stated age, well developed, well nourished in NAD. Cardiovascular: Normal rate and rhythm. S1,S2 noted.  No murmur, rubs or gallops noted. Pedal pulse 2+ bilaterally. Pulmonary/Chest: Normal effort and positive vesicular breath sounds. No respiratory distress. No wheezes, rales or ronchi noted.  Musculoskeletal: Able to wiggle toes. Pain with palpation at the base of the right 2nd great toe. Swelling noted of the entire 2nd great toe. No bruising noted. Gait slow and steady without device. Neurological: Alert and oriented. Sensation intact to BLE. Psychiatric: Mood and affect normal. Behavior is normal. Judgment and thought content normal.   CBC    Component Value Date/Time   WBC 16.7 (H) 11/07/2015 1525   RBC 3.92 11/07/2015 1525   HGB 9.8 (L) 11/07/2015 1525   HCT 29.5 (L) 11/07/2015 1525   PLT 96 (L) 11/07/2015 1525   MCV  75.3 (L) 11/07/2015 1525   MCH 25.0 (L) 11/07/2015 1525   MCHC 33.2 11/07/2015 1525   RDW 23.0 (H) 11/07/2015 1525      Assessment and Plan:  2nd Toe Pain and Swelling, Right Foot:  Xray today to r/o fracture Encouraged Ibuprofen, ice and rest Pt placed in post op shoe per her request  Make an appt for your annual exam Nicki Reaper, NP

## 2017-12-12 NOTE — Assessment & Plan Note (Signed)
Continue iron supplement Will check CBC and iron panel with annual exam

## 2018-01-20 ENCOUNTER — Ambulatory Visit (INDEPENDENT_AMBULATORY_CARE_PROVIDER_SITE_OTHER): Payer: PRIVATE HEALTH INSURANCE | Admitting: Obstetrics & Gynecology

## 2018-01-20 ENCOUNTER — Encounter: Payer: Self-pay | Admitting: Obstetrics & Gynecology

## 2018-01-20 VITALS — BP 92/60 | HR 92 | Ht 64.0 in | Wt 125.0 lb

## 2018-01-20 DIAGNOSIS — Z975 Presence of (intrauterine) contraceptive device: Secondary | ICD-10-CM

## 2018-01-20 DIAGNOSIS — Z124 Encounter for screening for malignant neoplasm of cervix: Secondary | ICD-10-CM

## 2018-01-20 DIAGNOSIS — Z0000 Encounter for general adult medical examination without abnormal findings: Secondary | ICD-10-CM

## 2018-01-20 DIAGNOSIS — Z1151 Encounter for screening for human papillomavirus (HPV): Secondary | ICD-10-CM

## 2018-01-20 DIAGNOSIS — K439 Ventral hernia without obstruction or gangrene: Secondary | ICD-10-CM

## 2018-01-20 DIAGNOSIS — Z01419 Encounter for gynecological examination (general) (routine) without abnormal findings: Secondary | ICD-10-CM

## 2018-01-20 DIAGNOSIS — Z1231 Encounter for screening mammogram for malignant neoplasm of breast: Secondary | ICD-10-CM

## 2018-01-20 NOTE — Patient Instructions (Signed)
Ventral Hernia A ventral hernia is a bulge of tissue from inside the abdomen that pushes through a weak area of the muscles that form the front wall of the abdomen. The tissues inside the abdomen are inside a sac (peritoneum). These tissues include the small intestine, large intestine, and the fatty tissue that covers the intestines (omentum). Sometimes, the bulge that forms a hernia contains intestines. Other hernias contain only fat. Ventral hernias do not go away without surgical treatment. There are several types of ventral hernias. You may have:  A hernia at an incision site from previous abdominal surgery (incisional hernia).  A hernia just above the belly button (epigastric hernia), or at the belly button (umbilical hernia). These types of hernias can develop from heavy lifting or straining.  A hernia that comes and goes (reducible hernia). It may be visible only when you lift or strain. This type of hernia can be pushed back into the abdomen (reduced).  A hernia that traps abdominal tissue inside the hernia (incarcerated hernia). This type of hernia does not reduce.  A hernia that cuts off blood flow to the tissues inside the hernia (strangulated hernia). The tissues can start to die if this happens. This is a very painful bulge that cannot be reduced. A strangulated hernia is a medical emergency.  What are the causes? This condition is caused by abdominal tissue putting pressure on an area of weakness in the abdominal muscles. What increases the risk? The following factors may make you more likely to develop this condition:  Being female.  Being 65 or older.  Being overweight or obese.  Having had previous abdominal surgery, especially if there was an infection after surgery.  Having had an injury to the abdominal wall.  Having had several pregnancies.  Having a buildup of fluid inside the abdomen (ascites).  What are the signs or symptoms? The only symptom of a ventral  hernia may be a painless bulge in the abdomen. A reducible hernia may be visible only when you strain, cough, or lift. Other symptoms may include:  Dull pain.  A feeling of pressure.  Signs and symptoms of a strangulated hernia may include:  Increasing pain.  Nausea and vomiting.  Pain when pressing on the hernia.  The skin over the hernia turning red or purple.  Constipation.  Blood in the stool (feces).  How is this diagnosed? This condition may be diagnosed based on:  Your symptoms.  Your medical history.  A physical exam. You may be asked to cough or strain while standing. These actions increase the pressure inside your abdomen and force the hernia through the opening in your muscles. Your health care provider may try to reduce the hernia by pressing on it.  Imaging studies, such as an ultrasound or CT scan.  How is this treated? This condition is treated with surgery. If you have a strangulated hernia, surgery is done as soon as possible. If your hernia is small and not incarcerated, you may be asked to lose some weight before surgery. Follow these instructions at home:  Follow instructions from your health care provider about eating or drinking restrictions.  If you are overweight, your health care provider may recommend that you increase your activity level and eat a healthier diet.  Do not lift anything that is heavier than 10 lb (4.5 kg).  Return to your normal activities as told by your health care provider. Ask your health care provider what activities are safe for you.  You may need to avoid activities that increase pressure on your hernia.  Take over-the-counter and prescription medicines only as told by your health care provider.  Keep all follow-up visits as told by your health care provider. This is important. Contact a health care provider if:  Your hernia gets larger.  Your hernia becomes painful. Get help right away if:  Your hernia becomes  increasingly painful.  You have pain along with any of the following: ? Changes in skin color in the area of the hernia. ? Nausea. ? Vomiting. ? Fever. Summary  A ventral hernia is a bulge of tissue from inside the abdomen that pushes through a weak area of the muscles that form the front wall of the abdomen.  This condition is treated with surgery, which may be urgent depending on your hernia.  Do not lift anything that is heavier than 10 lb (4.5 kg), and follow activity instructions from your health care provider. This information is not intended to replace advice given to you by your health care provider. Make sure you discuss any questions you have with your health care provider. Document Released: 01/29/2012 Document Revised: 09/29/2015 Document Reviewed: 09/29/2015 Elsevier Interactive Patient Education  2018 [place] Years, Female Preventive care refers to lifestyle choices and visits with your health care provider that can promote health and wellness. What does preventive care include?  A yearly physical exam. This is also called an annual well check.  Dental exams once or twice a year.  Routine eye exams. Ask your health care provider how often you should have your eyes checked.  Personal lifestyle choices, including: ? Daily care of your teeth and gums. ? Regular physical activity. ? Eating a healthy diet. ? Avoiding tobacco and drug use. ? Limiting alcohol use. ? Practicing safe sex. ? Taking low-dose aspirin daily starting at age 52. ? Taking vitamin and mineral supplements as recommended by your health care provider. What happens during an annual well check? The services and screenings done by your health care provider during your annual well check will depend on your age, overall health, lifestyle risk factors, and family history of disease. Counseling Your health care provider may ask you questions about your:  Alcohol  use.  Tobacco use.  Drug use.  Emotional well-being.  Home and relationship well-being.  Sexual activity.  Eating habits.  Work and work Statistician.  Method of birth control.  Menstrual cycle.  Pregnancy history.  Screening You may have the following tests or measurements:  Height, weight, and BMI.  Blood pressure.  Lipid and cholesterol levels. These may be checked every 5 years, or more frequently if you are over 19 years old.  Skin check.  Lung cancer screening. You may have this screening every year starting at age 49 if you have a 30-pack-year history of smoking and currently smoke or have quit within the past 15 years.  Fecal occult blood test (FOBT) of the stool. You may have this test every year starting at age 67.  Flexible sigmoidoscopy or colonoscopy. You may have a sigmoidoscopy every 5 years or a colonoscopy every 10 years starting at age 79.  Hepatitis C blood test.  Hepatitis B blood test.  Sexually transmitted disease (STD) testing.  Diabetes screening. This is done by checking your blood sugar (glucose) after you have not eaten for a while (fasting). You may have this done every 1-3 years.  Mammogram. This may be done every 1-2 years. Talk to your  health care provider about when you should start having regular mammograms. This may depend on whether you have a family history of breast cancer.  BRCA-related cancer screening. This may be done if you have a family history of breast, ovarian, tubal, or peritoneal cancers.  Pelvic exam and Pap test. This may be done every 3 years starting at age 32. Starting at age 48, this may be done every 5 years if you have a Pap test in combination with an HPV test.  Bone density scan. This is done to screen for osteoporosis. You may have this scan if you are at high risk for osteoporosis.  Discuss your test results, treatment options, and if necessary, the need for more tests with your health care  provider. Vaccines Your health care provider may recommend certain vaccines, such as:  Influenza vaccine. This is recommended every year.  Tetanus, diphtheria, and acellular pertussis (Tdap, Td) vaccine. You may need a Td booster every 10 years.  Varicella vaccine. You may need this if you have not been vaccinated.  Zoster vaccine. You may need this after age 31.  Measles, mumps, and rubella (MMR) vaccine. You may need at least one dose of MMR if you were born in 1957 or later. You may also need a second dose.  Pneumococcal 13-valent conjugate (PCV13) vaccine. You may need this if you have certain conditions and were not previously vaccinated.  Pneumococcal polysaccharide (PPSV23) vaccine. You may need one or two doses if you smoke cigarettes or if you have certain conditions.  Meningococcal vaccine. You may need this if you have certain conditions.  Hepatitis A vaccine. You may need this if you have certain conditions or if you travel or work in places where you may be exposed to hepatitis A.  Hepatitis B vaccine. You may need this if you have certain conditions or if you travel or work in places where you may be exposed to hepatitis B.  Haemophilus influenzae type b (Hib) vaccine. You may need this if you have certain conditions.  Talk to your health care provider about which screenings and vaccines you need and how often you need them. This information is not intended to replace advice given to you by your health care provider. Make sure you discuss any questions you have with your health care provider. Document Released: 03/10/2015 Document Revised: 11/01/2015 Document Reviewed: 12/13/2014 Elsevier Interactive Patient Education  [employer].

## 2018-01-20 NOTE — Progress Notes (Signed)
GYNECOLOGY ANNUAL PREVENTATIVE CARE ENCOUNTER NOTE  Subjective:   Christine Turner is a 42 y.o. [phone] female here for a routine annual gynecologic exam.  Current complaints: desires surgical referral for correction of her chronic umbilical hernia.   Denies abnormal vaginal bleeding, discharge, pelvic pain, problems with intercourse or other gynecologic concerns.    Gynecologic History Patient's last menstrual period was 01/18/2018. Contraception: IUD Last Pap: 2018. Results were: normal with negative HPV  Obstetric History OB History  Gravida Para Term Preterm AB Living  5 2 2   3 3   SAB TAB Ectopic Multiple Live Births  2 1   1 3     # Outcome Date GA Lbr Len/2nd Weight Sex Delivery Anes PTL Lv  5 Term 11/07/15 3224w0d 13:06 / 00:46 6 lb 14 oz (3.118 kg) M VBAC EPI  LIV  4 SAB 08/2014 1942w0d         3A Term 08/30/13 1740w2d  4 lb 5 oz (1.956 kg) M CS-LTranv   LIV     Complications: Chorioamnionitis  3B Term 08/30/13 4040w2d  4 lb 11 oz (2.126 kg) M CS-LTranv   LIV     Complications: Chorioamnionitis  2 SAB 09/2012 6342w0d         1 TAB 1991 511w0d           Past Medical History:  Diagnosis Date  . Anemia   . Thalassemia trait     Past Surgical History:  Procedure Laterality Date  . BREAST BIOPSY    . CESAREAN SECTION    . DILATION AND CURETTAGE OF UTERUS      Current Outpatient Medications on File Prior to Visit  Medication Sig Dispense Refill  . Prenatal Vit-Fe Fumarate-FA (PRENATAL MULTIVITAMIN) TABS tablet Take 1 tablet by mouth daily at 12 noon.    . Misc. Devices (BREAST PUMP) MISC Dispense one electric breast pump for patient (Patient not taking: Reported on 01/20/2018) 1 each 0   No current facility-administered medications on file prior to visit.     Allergies  Allergen Reactions  . Almond (Diagnostic) Anaphylaxis and Swelling  . Fish Allergy Anaphylaxis  . Penicillins Hives and Swelling    Has patient had a PCN reaction causing immediate rash,  facial/tongue/throat swelling, SOB or lightheadedness with hypotension: Unknown Has patient had a PCN reaction causing severe rash involving mucus membranes or skin necrosis: No Has patient had a PCN reaction that required hospitalization No Has patient had a PCN reaction occurring within the last 10 years: No If all of the above answers are "NO", then may proceed with Cephalosporin use.   Marland Kitchen. Carrot [Daucus Carota] Itching and Swelling    Swelling and itching is of the throat.    Social History:  reports that she has never smoked. She has never used smokeless tobacco. She reports that she drinks alcohol. She reports that she does not use drugs.  Family History  Problem Relation Age of Onset  . Hypertension Mother   . Diabetes Father   . Hypertension Father     The following portions of the patient's history were reviewed and updated as appropriate: allergies, current medications, past family history, past medical history, past social history, past surgical history and problem list.  Review of Systems Pertinent items noted in HPI and remainder of comprehensive ROS otherwise negative.   Objective:  BP 92/60   Pulse 92   Ht 5\' 4"  (1.626 m)   Wt 125 lb (56.7 kg)   LMP 01/18/2018  BMI 21.46 kg/m  CONSTITUTIONAL: Well-developed, well-nourished female in no acute distress.  HENT:  Normocephalic, atraumatic, External right and left ear normal. Oropharynx is clear and moist EYES: Conjunctivae and EOM are normal. Pupils are equal, round, and reactive to light. No scleral icterus.  NECK: Normal range of motion, supple, no masses.  Normal thyroid.  SKIN: Skin is warm and dry. No rash noted. Not diaphoretic. No erythema. No pallor. MUSCULOSKELETAL: Normal range of motion. No tenderness.  No cyanosis, clubbing, or edema.  2+ distal pulses. NEUROLOGIC: Alert and oriented to person, place, and time. Normal reflexes, muscle tone coordination. No cranial nerve deficit noted. PSYCHIATRIC: Normal  mood and affect. Normal behavior. Normal judgment and thought content. CARDIOVASCULAR: Normal heart rate noted, regular rhythm RESPIRATORY: Clear to auscultation bilaterally. Effort and breath sounds normal, no problems with respiration noted. BREASTS: Symmetric in size. No masses, skin changes, nipple drainage, or lymphadenopathy. ABDOMEN: Soft, normal bowel sounds, no distention noted.  No tenderness, rebound or guarding. Umbilical hernia noted, no erythema, no pain on palpation. PELVIC: Normal appearing external genitalia; normal appearing vaginal mucosa and cervix. IUD strings visualized.  No abnormal discharge noted.  Pap smear obtained.  Normal uterine size, no other palpable masses, no uterine or adnexal tenderness.    Assessment and Plan:  1. Ventral hernia without obstruction or gangrene Desires surgical management, wanted referral to [facility] in [place]. Referral sent.  - Ambulatory referral to General Surgery  2. Paragard IUD (copper intrauterine device) in place Keep in place for 10-12 years  3. Encounter for screening mammogram for breast cancer Mammogram scheduled - MM 3D SCREEN BREAST BILATERAL; Future  4. Well woman exam with routine gynecological exam - Cytology - PAP( [facility]) Will follow up results of pap smear and manage accordingly.  5. Well adult exam Preventative healthcare labs ordered, will follow up results and manage accordingly. - CBC; Future - TSH; Future - Hemoglobin A1c; Future - Lipid panel; Future - Comprehensive metabolic panel; Future  Routine preventative health maintenance measures emphasized. Please refer to After Visit Summary for other counseling recommendations.    Jaynie Collins, MD, FACOG Obstetrician & Gynecologist, [facility] [employer], [facility]

## 2018-01-20 NOTE — Progress Notes (Signed)
Last PAP in 2018 it was normal  Needs referral for mammogram  Has an umbilical hernia would like to discuss

## 2018-01-26 LAB — CYTOLOGY - PAP
Diagnosis: NEGATIVE
HPV: NOT DETECTED

## 2018-01-29 ENCOUNTER — Ambulatory Visit: Payer: Self-pay | Admitting: Obstetrics & Gynecology

## 2018-04-08 ENCOUNTER — Other Ambulatory Visit: Payer: BLUE CROSS/BLUE SHIELD

## 2018-04-08 DIAGNOSIS — Z0000 Encounter for general adult medical examination without abnormal findings: Secondary | ICD-10-CM

## 2018-04-09 LAB — COMPREHENSIVE METABOLIC PANEL
ALT: 8 IU/L (ref 0–32)
AST: 15 IU/L (ref 0–40)
Albumin/Globulin Ratio: 1.6 (ref 1.2–2.2)
Albumin: 4.4 g/dL (ref 3.8–4.8)
Alkaline Phosphatase: 69 IU/L (ref 39–117)
BUN/Creatinine Ratio: 9 (ref 9–23)
BUN: 8 mg/dL (ref 6–24)
Bilirubin Total: 1.1 mg/dL (ref 0.0–1.2)
CO2: 21 mmol/L (ref 20–29)
Calcium: 8.8 mg/dL (ref 8.7–10.2)
Chloride: 105 mmol/L (ref 96–106)
Creatinine, Ser: 0.86 mg/dL (ref 0.57–1.00)
GFR calc Af Amer: 96 mL/min/{1.73_m2} (ref >59)
GFR calc non Af Amer: 83 mL/min/{1.73_m2} (ref >59)
Globulin, Total: 2.7 g/dL (ref 1.5–4.5)
Glucose: 87 mg/dL (ref 65–99)
Potassium: 4 mmol/L (ref 3.5–5.2)
Sodium: 140 mmol/L (ref 134–144)
Total Protein: 7.1 g/dL (ref 6.0–8.5)

## 2018-04-09 LAB — CBC
Hematocrit: 29.3 % — ABNORMAL LOW (ref 34.0–46.6)
Hemoglobin: 9.4 g/dL — ABNORMAL LOW (ref 11.1–15.9)
MCH: 22.4 pg — ABNORMAL LOW (ref 26.6–33.0)
MCHC: 32.1 g/dL (ref 31.5–35.7)
MCV: 70 fL — ABNORMAL LOW (ref 79–97)
NRBC: 5 % — ABNORMAL HIGH (ref 0–0)
Platelets: 293 10*3/uL (ref 150–450)
RBC: 4.19 x10E6/uL (ref 3.77–5.28)
RDW: 24.4 % — ABNORMAL HIGH (ref 11.7–15.4)
WBC: 6.7 10*3/uL (ref 3.4–10.8)

## 2018-04-09 LAB — LIPID PANEL
Chol/HDL Ratio: 2.9 ratio (ref 0.0–4.4)
Cholesterol, Total: 84 mg/dL — ABNORMAL LOW (ref 100–199)
HDL: 29 mg/dL — ABNORMAL LOW (ref >39)
LDL Calculated: 42 mg/dL (ref 0–99)
Triglycerides: 63 mg/dL (ref 0–149)
VLDL Cholesterol Cal: 13 mg/dL (ref 5–40)

## 2018-04-09 LAB — HEMOGLOBIN A1C
Est. average glucose Bld gHb Est-mCnc: <74 mg/dL
Hgb A1c MFr Bld: <4.2 % — ABNORMAL LOW (ref 4.8–5.6)

## 2018-04-09 LAB — TSH: TSH: 1.84 u[IU]/mL (ref 0.450–4.500)

## 2018-05-18 ENCOUNTER — Other Ambulatory Visit: Payer: Self-pay

## 2018-05-18 ENCOUNTER — Encounter: Payer: Self-pay | Admitting: Internal Medicine

## 2018-05-18 ENCOUNTER — Ambulatory Visit (INDEPENDENT_AMBULATORY_CARE_PROVIDER_SITE_OTHER): Payer: BLUE CROSS/BLUE SHIELD | Admitting: Internal Medicine

## 2018-05-18 DIAGNOSIS — J301 Allergic rhinitis due to pollen: Secondary | ICD-10-CM

## 2018-05-18 MED ORDER — OLOPATADINE HCL 0.2 % OP SOLN: 1.0000 [drp] | Freq: Every day | OPHTHALMIC | 0 refills | Status: DC

## 2018-05-18 NOTE — Patient Instructions (Signed)

## 2018-05-18 NOTE — Progress Notes (Signed)
Virtual Visit via Telephone Note  I connected with Christine Turner on 05/18/18 at 3:00 by telephone and verified that I am speaking with the correct person using two identifiers.   I discussed the limitations, risks, security and privacy concerns of performing an evaluation and management service by telephone and the availability of in person appointments. I also discussed with the patient that there may be a patient responsible charge related to this service. The patient expressed understanding and agreed to proceed.   History of Present Illness: Pt reports itchy eyes, runny nose, nasal congestion, scratchy throat and cough. This started 2 weeks ago. She is blowing clear mucous out of her nose. She denies sore throat. The cough is non productive. She reports associated sneezing. She denies ear pain or shortness of breath. She denies fever, chills or body aches. She has tried Zyrtec with some relief. She would like to see an allergist. She was seeing a allergist before she moved to [place], they were discussing allergy shots but she had not started them yet. She has not had sick contacts.    Observations/Objective: Alert and oriented x 3. Able to complete sentences. No noticeable congestion or SOB.   Assessment and Plan:   Allergic Rhinitis:  Continue Zyrtec Add in Flonase RX for Pataday ophthalmic solution daily prn Referral to allergy placed per pt request     Follow Up Instructions:    I discussed the assessment and treatment plan with the patient. The patient was provided an opportunity to ask questions and all were answered. The patient agreed with the plan and demonstrated an understanding of the instructions.   The patient was advised to call back or seek an in-person evaluation if the symptoms worsen or if the condition fails to improve as anticipated.  I provided 7 minutes of non-face-to-face time during this encounter.   Nicki Reaper, NP

## 2018-09-22 ENCOUNTER — Encounter: Payer: Self-pay | Admitting: Internal Medicine

## 2018-09-22 ENCOUNTER — Ambulatory Visit (INDEPENDENT_AMBULATORY_CARE_PROVIDER_SITE_OTHER): Payer: BC Managed Care – PPO | Admitting: Internal Medicine

## 2018-09-22 DIAGNOSIS — F419 Anxiety disorder, unspecified: Secondary | ICD-10-CM | POA: Diagnosis not present

## 2018-09-22 DIAGNOSIS — R059 Cough, unspecified: Secondary | ICD-10-CM

## 2018-09-22 DIAGNOSIS — R05 Cough: Secondary | ICD-10-CM

## 2018-09-22 DIAGNOSIS — F5104 Psychophysiologic insomnia: Secondary | ICD-10-CM

## 2018-09-22 MED ORDER — HYDROXYZINE HCL 10 MG PO TABS: 10.0000 mg | ORAL_TABLET | Freq: Two times a day (BID) | ORAL | 0 refills | Status: DC | PRN

## 2018-09-22 NOTE — Progress Notes (Signed)
Virtual Visit via Video Note  I connected with Christine Turner on 09/22/18 at 10:00 AM EDT by a video enabled telemedicine application and verified that I am speaking with the correct person using two identifiers.  Location: Patient: Home Provider: Office   I discussed the limitations of evaluation and management by telemedicine and the availability of in person appointments. The patient expressed understanding and agreed to proceed.  History of Present Illness:  Pt reports multiple concerns.  Insomnia: She has trouble falling asleep and staying asleep. She feels like this is triggered by stress. She has tried Melatonin and Benadryl with some relief. She does not feel rested when she wakes up but does not nap during the day. She does not snore at night. She has never had a sleep study.  She is feeling anxious and overwhelmed. She is working full time, caring for 3 kids under the age of 776, one of which is autistic. Her youngest sons school ([employer]) shut down so she has been trying to scramble to find childcare for him. She was separated but recently reconciled with her husband and bought a house. They are trying to move which is stressful for her. She has taken Hydroxyzine in the past and would like to know if she could use this again for anxiety. She is not currently seeing a therapist. She denies depression, SI/HI.  She also reports nasal congestion and  cough. This started 1 week ago. She is not blowing anything out of her nose. The cough is dry and nonproductive. She denies runny nose, ear pain, sore throat, loss of taste or smell, or SOB. She denies fever, chills or body aches. She reports her kids have had a slight cough but denies known exposure to COVID. Her symptoms are improving with Alka Seltzer.    Past Medical History:  Diagnosis Date  . Anemia   . Thalassemia trait     Current Outpatient Medications  Medication Sig Dispense Refill  . diphenhydrAMINE (BENADRYL) 25 mg capsule  Take 25 mg by mouth every 6 (six) hours as needed.    . Melatonin 5 MG CAPS Take by mouth.    . Prenatal Vit-Fe Fumarate-FA (PRENATAL MULTIVITAMIN) TABS tablet Take 1 tablet by mouth daily at 12 noon.    . hydrOXYzine (ATARAX/VISTARIL) 10 MG tablet Take 1 tablet (10 mg total) by mouth 2 (two) times daily as needed. 60 tablet 0   No current facility-administered medications for this visit.     Allergies  Allergen Reactions  . Almond (Diagnostic) Anaphylaxis and Swelling  . Fish Allergy Anaphylaxis  . Penicillins Hives and Swelling    Has patient had a PCN reaction causing immediate rash, facial/tongue/throat swelling, SOB or lightheadedness with hypotension: Unknown Has patient had a PCN reaction causing severe rash involving mucus membranes or skin necrosis: No Has patient had a PCN reaction that required hospitalization No Has patient had a PCN reaction occurring within the last 10 years: No If all of the above answers are "NO", then may proceed with Cephalosporin use.   Marland Kitchen. Carrot [Daucus Carota] Itching and Swelling    Swelling and itching is of the throat.    Family History  Problem Relation Age of Onset  . Hypertension Mother   . Diabetes Father   . Hypertension Father     Social History   Socioeconomic History  . Marital status: Married    Spouse name: Not on file  . Number of children: Not on file  . Years of education:  Not on file  . Highest education level: Not on file  Occupational History  . Not on file  Social Needs  . Financial resource strain: Not on file  . Food insecurity    Worry: Not on file    Inability: Not on file  . Transportation needs    Medical: Not on file    Non-medical: Not on file  Tobacco Use  . Smoking status: Never Smoker  . Smokeless tobacco: Never Used  Substance and Sexual Activity  . Alcohol use: Yes    Comment: occasional  . Drug use: No  . Sexual activity: Yes    Birth control/protection: I.U.D.  Lifestyle  . Physical  activity    Days per week: Not on file    Minutes per session: Not on file  . Stress: Not on file  Relationships  . Social Musicianconnections    Talks on phone: Not on file    Gets together: Not on file    Attends religious service: Not on file    Active member of club or organization: Not on file    Attends meetings of clubs or organizations: Not on file    Relationship status: Not on file  . Intimate partner violence    Fear of current or ex partner: Not on file    Emotionally abused: Not on file    Physically abused: Not on file    Forced sexual activity: Not on file  Other Topics Concern  . Not on file  Social History Narrative  . Not on file     Constitutional: Pt reports fatigue. Denies fever, malaise,  headache or abrupt weight changes.  HEENT: Denies eye pain, eye redness, ear pain, ringing in the ears, wax buildup, runny nose, nasal congestion, bloody nose, or sore throat. Respiratory: Pt reports cough. Denies difficulty breathing, shortness of breath, or sputum production.   Cardiovascular: Denies chest pain, chest tightness, palpitations or swelling in the hands or feet.  Neurological: Pt reports insomnia. Denies dizziness, difficulty with memory, difficulty with speech or problems with balance and coordination.  Psych: Pt reports anxiety. Denies  depression, SI/HI.  No other specific complaints in a complete review of systems (except as listed in HPI above).  Observations/Objective: There were no vitals taken for this visit. Wt Readings from Last 3 Encounters:  01/20/18 125 lb (56.7 kg)  12/12/17 129 lb (58.5 kg)  04/05/16 136 lb (61.7 kg)    General: Appears her stated age, well developed, well nourished in NAD. HEENT: Head: normal shape and size;  Pulmonary/Chest: Normal effort with intermittent dry cough. No respiratory distress.  Neurological: Alert and oriented.  Psychiatric: Mildly anxious appearing.  BMET    Component Value Date/Time   NA 140 04/08/2018  1018   K 4.0 04/08/2018 1018   CL 105 04/08/2018 1018   CO2 21 04/08/2018 1018   GLUCOSE 87 04/08/2018 1018   BUN 8 04/08/2018 1018   CREATININE 0.86 04/08/2018 1018   CALCIUM 8.8 04/08/2018 1018   GFRNONAA 83 04/08/2018 1018   GFRAA 96 04/08/2018 1018    Lipid Panel     Component Value Date/Time   CHOL 84 (L) 04/08/2018 1018   TRIG 63 04/08/2018 1018   HDL 29 (L) 04/08/2018 1018   CHOLHDL 2.9 04/08/2018 1018   LDLCALC 42 04/08/2018 1018    CBC    Component Value Date/Time   WBC 6.7 04/08/2018 1018   WBC 16.7 (H) 11/07/2015 1525   RBC 4.19 04/08/2018 1018  RBC 3.92 11/07/2015 1525   HGB 9.4 (L) 04/08/2018 1018   HCT 29.3 (L) 04/08/2018 1018   PLT 293 04/08/2018 1018   MCV 70 (L) 04/08/2018 1018   MCH 22.4 (L) 04/08/2018 1018   MCH 25.0 (L) 11/07/2015 1525   MCHC 32.1 04/08/2018 1018   MCHC 33.2 11/07/2015 1525   RDW 24.4 (H) 04/08/2018 1018    Hgb A1C Lab Results  Component Value Date   HGBA1C <4.2 (L) 04/08/2018        Assessment and Plan:  Cough:  Likely allergies Start Claritin and Flonase OTC She is not interested in COVID testing at this time Discussed the importance of social distancing, mask wearing and hand washing  Anxiety:  Support offered today She is not interested in daily medication therapy RX for Hydroxyzine 10 mg BID prn- sedation caution given She is not interested in seeing a therapist at this time Will fill out STD for 2 weeks starting 8/13, returning 8/17  Insomnia:  R/t anxiety RX for Hydroxyzine 10 mg BID prn- sedation caution given Will monitor  Return precautions discussed  Follow Up Instructions:    I discussed the assessment and treatment plan with the patient. The patient was provided an opportunity to ask questions and all were answered. The patient agreed with the plan and demonstrated an understanding of the instructions.   The patient was advised to call back or seek an in-person evaluation if the symptoms  worsen or if the condition fails to improve as anticipated.     Webb Silversmith, NP

## 2018-09-22 NOTE — Patient Instructions (Signed)

## 2018-10-01 ENCOUNTER — Telehealth: Payer: Self-pay | Admitting: Internal Medicine

## 2018-10-01 NOTE — Telephone Encounter (Signed)
Pt called requesting a letter for employment accommodations. Pt was seen 7/28 OV and her employer said a letter explaining stress, anxiety, insomnia, special needs child and the recent death of her father requires her to take intermittent time off. Please include dates 8/3-8/18. No formal paperwork is needed, just a letter. Pt will access the letter thru MyChart, please let patient know when complete.

## 2018-10-02 NOTE — Telephone Encounter (Signed)
[place] for letter. I thought we were going to fill out short term disability, but it appear she only needs a letter. Excused from 8/3-8/17

## 2018-10-05 ENCOUNTER — Encounter: Payer: Self-pay | Admitting: Internal Medicine

## 2018-10-05 NOTE — Telephone Encounter (Signed)
Patient called back to check the status of letter she requested. She stated she received a call from her employer on Saturday and she would like to get this letter sent to them as soon as possible.     Patient C/B # [id number]

## 2018-10-06 NOTE — Telephone Encounter (Signed)
Patient called back to check status of my chart message  Wanted to make sure that this has been sent to you and paper work was attached. Patient wanted to see if this could be done by tomorrow after.

## 2018-10-06 NOTE — Telephone Encounter (Signed)
ppw printed and placed in your box

## 2018-10-08 NOTE — Telephone Encounter (Signed)
Form has already been given to Robin to fill out

## 2018-10-09 NOTE — Telephone Encounter (Signed)
Paperwork in regina's in box For review and signature

## 2018-10-14 NOTE — Telephone Encounter (Signed)
Paperwork refaxed pt aware

## 2018-10-16 ENCOUNTER — Other Ambulatory Visit: Payer: Self-pay | Admitting: Internal Medicine

## 2018-10-16 NOTE — Telephone Encounter (Signed)
Last filled 09/22/18... is pt to continue medication? Please advise

## 2018-12-11 ENCOUNTER — Encounter: Payer: Self-pay | Admitting: Radiology

## 2019-02-05 ENCOUNTER — Telehealth: Payer: Self-pay

## 2019-02-05 NOTE — Telephone Encounter (Signed)
Unfortunately, this provider is no longer taking new pts or TOC.

## 2019-02-05 NOTE — Telephone Encounter (Signed)
Copied from [place] [phone]. Topic: Appointment Scheduling - Transfer of Care >> Feb 04, 2019  4:45 PM Erick Blinks wrote: Pt is requesting to transfer FROM: Dr. Garnette Gunner  Pt is requesting to transfer TO: Dr. Volanda Napoleon Reason for requested transfer: Pt has bought a house near [place] office   Send CRM to patient's current PCP (transferring FROM).

## 2019-02-08 NOTE — Telephone Encounter (Signed)
Called and advised patient. She states she will call [facility] at a convenient time to see if any providers are taking new patients.

## 2019-03-08 ENCOUNTER — Encounter: Payer: Self-pay | Admitting: *Deleted

## 2019-03-08 ENCOUNTER — Telehealth: Payer: Self-pay | Admitting: Student

## 2019-03-08 ENCOUNTER — Ambulatory Visit: Payer: BC Managed Care – PPO | Admitting: Family Medicine

## 2019-03-08 NOTE — Telephone Encounter (Signed)
Called pt and informed her that an order is not necessary for a screening mammogram. I confirmed with her that she is not experiencing any problems. [facility] at her convenience. Pt requested a MyChart message to be sent to her with the phone number. Message sent.

## 2019-03-08 NOTE — Telephone Encounter (Signed)
The patient stated she would like a mammogram completed, she would like Dr. Macon Large to send an order for her to have one done and someone give her a call back.

## 2019-03-16 ENCOUNTER — Ambulatory Visit: Payer: BC Managed Care – PPO | Admitting: Obstetrics & Gynecology

## 2019-03-18 ENCOUNTER — Encounter: Payer: BC Managed Care – PPO | Admitting: Family Medicine

## 2019-03-29 ENCOUNTER — Encounter: Payer: Self-pay | Admitting: Nurse Practitioner

## 2019-03-29 ENCOUNTER — Other Ambulatory Visit: Payer: Self-pay

## 2019-03-29 ENCOUNTER — Ambulatory Visit (INDEPENDENT_AMBULATORY_CARE_PROVIDER_SITE_OTHER): Payer: BC Managed Care – PPO | Admitting: Nurse Practitioner

## 2019-03-29 VITALS — BP 101/48 | HR 82 | Temp 98.8°F | Wt 130.0 lb

## 2019-03-29 DIAGNOSIS — F419 Anxiety disorder, unspecified: Secondary | ICD-10-CM | POA: Diagnosis not present

## 2019-03-29 DIAGNOSIS — Z01419 Encounter for gynecological examination (general) (routine) without abnormal findings: Secondary | ICD-10-CM

## 2019-03-29 DIAGNOSIS — D561 Beta thalassemia: Secondary | ICD-10-CM

## 2019-03-29 MED ORDER — HYDROXYZINE HCL 10 MG PO TABS: 10.0000 mg | ORAL_TABLET | Freq: Three times a day (TID) | ORAL | 0 refills | Status: DC | PRN

## 2019-03-29 MED ORDER — EPINEPHRINE 0.3 MG/0.3ML IJ SOAJ: 0.3000 mg | INTRAMUSCULAR | 2 refills | Status: AC | PRN

## 2019-03-29 NOTE — Progress Notes (Signed)
GYNECOLOGY ANNUAL PREVENTATIVE CARE ENCOUNTER NOTE  Subjective:   Christine Turner is a 44 y.o. [phone] female here for a routine annual gynecologic exam.  Current complaints: Wants Retin A for acne, asked for anxiety meds and refill of epipen.   Denies abnormal vaginal bleeding, discharge, pelvic pain, problems with intercourse or other gynecologic concerns.    Gynecologic History Patient's last menstrual period was 03/07/2019. Contraception: IUD Last Pap: 01-20-18. Results were: normal with No HRHPV Last mammogram: has not had.  Will schedule.   Obstetric History OB History  Gravida Para Term Preterm AB Living  5 2 2   3 3   SAB TAB Ectopic Multiple Live Births  2 1   1 3     # Outcome Date GA Lbr Len/2nd Weight Sex Delivery Anes PTL Lv  5 Term 11/07/15 [redacted]w[redacted]d 13:06 / 00:46 6 lb 14 oz (3.118 kg) M VBAC EPI  LIV  4 SAB 08/2014 [redacted]w[redacted]d         3A Term 08/30/13 [redacted]w[redacted]d  4 lb 5 oz (1.956 kg) M CS-LTranv   LIV     Complications: Chorioamnionitis  3B Term 08/30/13 [redacted]w[redacted]d  4 lb 11 oz (2.126 kg) M CS-LTranv   LIV     Complications: Chorioamnionitis  2 SAB 09/2012 [redacted]w[redacted]d         1 TAB 1991 [redacted]w[redacted]d           Past Medical History:  Diagnosis Date  . Anemia   . Thalassemia trait     Past Surgical History:  Procedure Laterality Date  . BREAST BIOPSY    . CESAREAN SECTION    . DILATION AND CURETTAGE OF UTERUS      Current Outpatient Medications on File Prior to Visit  Medication Sig Dispense Refill  . cetirizine (ZYRTEC) 10 MG tablet Take 10 mg by mouth daily.    . Coenzyme Q10 (COQ10) 200 MG CAPS Take 2 capsules by mouth.    . diphenhydrAMINE (BENADRYL) 25 mg capsule Take 25 mg by mouth every 6 (six) hours as needed.    . Melatonin 5 MG CAPS Take by mouth.    . Prenatal Vit-Fe Fumarate-FA (PRENATAL MULTIVITAMIN) TABS tablet Take 1 tablet by mouth daily at 12 noon.     No current facility-administered medications on file prior to visit.    Allergies  Allergen Reactions  . Almond  (Diagnostic) Anaphylaxis and Swelling  . Fish Allergy Anaphylaxis  . Penicillins Hives and Swelling    Has patient had a PCN reaction causing immediate rash, facial/tongue/throat swelling, SOB or lightheadedness with hypotension: Unknown Has patient had a PCN reaction causing severe rash involving mucus membranes or skin necrosis: No Has patient had a PCN reaction that required hospitalization No Has patient had a PCN reaction occurring within the last 10 years: No If all of the above answers are "NO", then may proceed with Cephalosporin use.   Marland Kitchen Carrot [Daucus Carota] Itching and Swelling    Swelling and itching is of the throat.    Social History   Socioeconomic History  . Marital status: Married    Spouse name: Not on file  . Number of children: Not on file  . Years of education: Not on file  . Highest education level: Not on file  Occupational History  . Not on file  Tobacco Use  . Smoking status: Never Smoker  . Smokeless tobacco: Never Used  Substance and Sexual Activity  . Alcohol use: Yes    Comment: occasional  .  Drug use: No  . Sexual activity: Yes    Birth control/protection: I.U.D.  Other Topics Concern  . Not on file  Social History Narrative  . Not on file   Social Determinants of Health   Financial Resource Strain:   . Difficulty of Paying Living Expenses: Not on file  Food Insecurity:   . Worried About Programme researcher, broadcasting/film/video in the Last Year: Not on file  . Ran Out of Food in the Last Year: Not on file  Transportation Needs:   . Lack of Transportation (Medical): Not on file  . Lack of Transportation (Non-Medical): Not on file  Physical Activity:   . Days of Exercise per Week: Not on file  . Minutes of Exercise per Session: Not on file  Stress:   . Feeling of Stress : Not on file  Social Connections:   . Frequency of Communication with Friends and Family: Not on file  . Frequency of Social Gatherings with Friends and Family: Not on file  . Attends  Religious Services: Not on file  . Active Member of Clubs or Organizations: Not on file  . Attends Banker Meetings: Not on file  . Marital Status: Not on file  Intimate Partner Violence:   . Fear of Current or Ex-Partner: Not on file  . Emotionally Abused: Not on file  . Physically Abused: Not on file  . Sexually Abused: Not on file    Family History  Problem Relation Age of Onset  . Hypertension Mother   . Diabetes Father   . Hypertension Father     The following portions of the patient's history were reviewed and updated as appropriate: allergies, current medications, past family history, past medical history, past social history, past surgical history and problem list.  Review of Systems Pertinent items noted in HPI and remainder of comprehensive ROS otherwise negative.   Objective:  BP (!) 101/48   Pulse 82   Temp 98.8 F (37.1 C)   Wt 130 lb (59 kg)   LMP 03/07/2019   BMI 22.31 kg/m  CONSTITUTIONAL: Well-developed, well-nourished female in no acute distress.  HENT:  Normocephalic, atraumatic, External right and left ear normal.  EYES: Conjunctivae and EOM are normal. Pupils are equal, round.  No scleral icterus.  NECK: Normal range of motion, supple, no masses.  Normal thyroid.  SKIN: Skin is warm and dry. No rash noted. Not diaphoretic. No erythema. No pallor. NEUROLOGIC: Alert and oriented to person, place, and time. Normal reflexes, muscle tone coordination. No cranial nerve deficit noted. PSYCHIATRIC: Normal mood and affect. Normal behavior. Normal judgment and thought content. CARDIOVASCULAR: Normal heart rate noted, regular rhythm RESPIRATORY: Clear to auscultation bilaterally. Effort and breath sounds normal, no problems with respiration noted. BREASTS: Symmetric in size. No masses, skin changes, nipple drainage, or lymphadenopathy. ABDOMEN: Soft, no distention noted.  No tenderness, rebound or guarding.  PELVIC: deferred - no problems and pap is  not due MUSCULOSKELETAL: Normal range of motion. No tenderness.  No cyanosis, clubbing, or edema.    Assessment and Plan:  1. Anxiety Refilled medication and will refer for further nonpharmicological interventions for anxiety. - Ambulatory referral to [facility]  2.  Annual GYN exam Refilled epipen as then one she has is expiring soon. Did not prescribe retin A - advised seeing family practice or dermatologist for prescribing. Checked CBC as she has thalassemia and has been tired.  Will follow up results of pap smear and manage accordingly. Mammogram scheduled  Routine preventative health maintenance measures emphasized. Please refer to After Visit Summary for other counseling recommendations.    Nolene Bernheim, RN, MSN, NP-BC Nurse Practitioner, [facility]

## 2019-03-30 LAB — CBC
Hematocrit: 30.4 % — ABNORMAL LOW (ref 34.0–46.6)
Hemoglobin: 9.7 g/dL — ABNORMAL LOW (ref 11.1–15.9)
MCH: 22.5 pg — ABNORMAL LOW (ref 26.6–33.0)
MCHC: 31.9 g/dL (ref 31.5–35.7)
MCV: 71 fL — ABNORMAL LOW (ref 79–97)
NRBC: 4 % — ABNORMAL HIGH (ref 0–0)
Platelets: 146 10*3/uL — ABNORMAL LOW (ref 150–450)
RBC: 4.31 x10E6/uL (ref 3.77–5.28)
RDW: 22.6 % — ABNORMAL HIGH (ref 11.7–15.4)
WBC: 6.7 10*3/uL (ref 3.4–10.8)

## 2019-04-05 NOTE — BH Specialist Note (Deleted)
Pt requests to reschedule, due to a scheduling conflict, to February 26th at 9:15am.   [facility] via Telemedicine Video Visit  04/05/2019 Zurri Rudden [phone]  Rae Lips

## 2019-04-06 ENCOUNTER — Ambulatory Visit
Admission: RE | Admit: 2019-04-06 | Discharge: 2019-04-06 | Disposition: A | Discharge status: Home / Self Care | Payer: BC Managed Care – PPO | Source: Ambulatory Visit | Attending: Nurse Practitioner | Admitting: Nurse Practitioner

## 2019-04-06 ENCOUNTER — Other Ambulatory Visit: Payer: Self-pay

## 2019-04-06 DIAGNOSIS — Z01419 Encounter for gynecological examination (general) (routine) without abnormal findings: Secondary | ICD-10-CM

## 2019-04-07 ENCOUNTER — Institutional Professional Consult (permissible substitution): Payer: BC Managed Care – PPO | Admitting: Clinical

## 2019-04-23 ENCOUNTER — Institutional Professional Consult (permissible substitution): Payer: BC Managed Care – PPO

## 2019-07-16 ENCOUNTER — Telehealth: Payer: Self-pay | Admitting: Internal Medicine

## 2019-07-16 NOTE — Telephone Encounter (Signed)
Called patient today to do covid screening questions before her physical appointment on Monday 5/24   Patient requested appointment sooner in the day or another day that week. Looking at the schedule there were no other appointments opened for physicals due to provider being out of office at the end of the week .  I explained this to the patient and told her our next available date I seen was June 15th  Patient stated " so I cant see another provider, your office is being very ridged, this is not good patient experience "   Explained that for physical appointment they ask for you to stay with your primary provider, explained that for other appointments if the provider is out we are more than happy to schedule with another provider but for a physical we can not . Patient continued to tell me how ridged the office was with this and this is not a good patient experience . I asked if she would like to speak to the supervisor since she was not happy with the patient experience  And the supervisor can further explain  Patient went on " Im comprehensive, I understand the words coming out of your mouth, maybe its time for me to find a new office"  I apologized to patient and let her know I have rescheduled her to June 15 @ 11:45.  Patient disconnected the call     Just an [place]

## 2019-07-16 NOTE — Telephone Encounter (Signed)
noted 

## 2019-07-19 ENCOUNTER — Encounter: Payer: BC Managed Care – PPO | Admitting: Internal Medicine

## 2019-08-09 ENCOUNTER — Other Ambulatory Visit: Payer: Self-pay

## 2019-08-10 ENCOUNTER — Encounter: Payer: Self-pay | Admitting: Internal Medicine

## 2019-08-10 ENCOUNTER — Ambulatory Visit (INDEPENDENT_AMBULATORY_CARE_PROVIDER_SITE_OTHER): Payer: BC Managed Care – PPO | Admitting: Internal Medicine

## 2019-08-10 VITALS — BP 104/68 | HR 74 | Temp 98.1°F | Ht 64.0 in | Wt 124.0 lb

## 2019-08-10 DIAGNOSIS — L709 Acne, unspecified: Secondary | ICD-10-CM

## 2019-08-10 DIAGNOSIS — Z8349 Family history of other endocrine, nutritional and metabolic diseases: Secondary | ICD-10-CM

## 2019-08-10 DIAGNOSIS — Z1211 Encounter for screening for malignant neoplasm of colon: Secondary | ICD-10-CM | POA: Diagnosis not present

## 2019-08-10 DIAGNOSIS — D508 Other iron deficiency anemias: Secondary | ICD-10-CM | POA: Diagnosis not present

## 2019-08-10 DIAGNOSIS — F419 Anxiety disorder, unspecified: Secondary | ICD-10-CM | POA: Diagnosis not present

## 2019-08-10 DIAGNOSIS — Z0000 Encounter for general adult medical examination without abnormal findings: Secondary | ICD-10-CM

## 2019-08-10 LAB — LIPID PANEL
Cholesterol: 69 mg/dL (ref 0–200)
HDL: 27.4 mg/dL — ABNORMAL LOW (ref >39.00)
LDL Cholesterol: 30 mg/dL (ref 0–99)
NonHDL: 41.78
Total CHOL/HDL Ratio: 3
Triglycerides: 57 mg/dL (ref 0.0–149.0)
VLDL: 11.4 mg/dL (ref 0.0–40.0)

## 2019-08-10 LAB — COMPREHENSIVE METABOLIC PANEL
ALT: 6 U/L (ref 0–35)
AST: 13 U/L (ref 0–37)
Albumin: 4.4 g/dL (ref 3.5–5.2)
Alkaline Phosphatase: 52 U/L (ref 39–117)
BUN: 11 mg/dL (ref 6–23)
CO2: 26 mEq/L (ref 19–32)
Calcium: 9 mg/dL (ref 8.4–10.5)
Chloride: 107 mEq/L (ref 96–112)
Creatinine, Ser: 1.04 mg/dL (ref 0.40–1.20)
GFR: 69.26 mL/min (ref >60.00)
Glucose, Bld: 80 mg/dL (ref 70–99)
Potassium: 3.7 mEq/L (ref 3.5–5.1)
Sodium: 137 mEq/L (ref 135–145)
Total Bilirubin: 1.5 mg/dL — ABNORMAL HIGH (ref 0.2–1.2)
Total Protein: 7.2 g/dL (ref 6.0–8.3)

## 2019-08-10 LAB — CBC
HCT: 29.8 % — ABNORMAL LOW (ref 36.0–46.0)
Hemoglobin: 9.8 g/dL — ABNORMAL LOW (ref 12.0–15.0)
MCHC: 32.8 g/dL (ref 30.0–36.0)
MCV: 69.4 fl — ABNORMAL LOW (ref 78.0–100.0)
Platelets: 172 10*3/uL (ref 150.0–400.0)
RBC: 4.29 Mil/uL (ref 3.87–5.11)
RDW: 21.8 % — ABNORMAL HIGH (ref 11.5–15.5)
WBC: 6.9 10*3/uL (ref 4.0–10.5)

## 2019-08-10 LAB — VITAMIN D 25 HYDROXY (VIT D DEFICIENCY, FRACTURES): VITD: 22.52 ng/mL — ABNORMAL LOW (ref 30.00–100.00)

## 2019-08-10 LAB — TSH: TSH: 1.05 u[IU]/mL (ref 0.35–4.50)

## 2019-08-10 MED ORDER — TRETINOIN 0.025 % EX CREA
TOPICAL_CREAM | Freq: Every day | CUTANEOUS | 0 refills | Status: DC
Start: 2019-08-10 — End: 2020-10-24

## 2019-08-10 NOTE — Patient Instructions (Signed)
Health Maintenance, Female Adopting a healthy lifestyle and getting preventive care are important in promoting health and wellness. Ask your health care provider about:  The right schedule for you to have regular tests and exams.  Things you can do on your own to prevent diseases and keep yourself healthy. What should I know about diet, weight, and exercise? Eat a healthy diet   Eat a diet that includes plenty of vegetables, fruits, low-fat dairy products, and lean protein.  Do not eat a lot of foods that are high in solid fats, added sugars, or sodium. Maintain a healthy weight Body mass index (BMI) is used to identify weight problems. It estimates body fat based on height and weight. Your health care provider can help determine your BMI and help you achieve or maintain a healthy weight. Get regular exercise Get regular exercise. This is one of the most important things you can do for your health. Most adults should:  Exercise for at least 150 minutes each week. The exercise should increase your heart rate and make you sweat (moderate-intensity exercise).  Do strengthening exercises at least twice a week. This is in addition to the moderate-intensity exercise.  Spend less time sitting. Even light physical activity can be beneficial. Watch cholesterol and blood lipids Have your blood tested for lipids and cholesterol at 45 years of age, then have this test every 5 years. Have your cholesterol levels checked more often if:  Your lipid or cholesterol levels are high.  You are older than 45 years of age.  You are at high risk for heart disease. What should I know about cancer screening? Depending on your health history and family history, you may need to have cancer screening at various ages. This may include screening for:  Breast cancer.  Cervical cancer.  Colorectal cancer.  Skin cancer.  Lung cancer. What should I know about heart disease, diabetes, and high blood  pressure? Blood pressure and heart disease  High blood pressure causes heart disease and increases the risk of stroke. This is more likely to develop in people who have high blood pressure readings, are of African descent, or are overweight.  Have your blood pressure checked: ? Every 3-5 years if you are 18-39 years of age. ? Every year if you are 40 years old or older. Diabetes Have regular diabetes screenings. This checks your fasting blood sugar level. Have the screening done:  Once every three years after age 40 if you are at a normal weight and have a low risk for diabetes.  More often and at a younger age if you are overweight or have a high risk for diabetes. What should I know about preventing infection? Hepatitis B If you have a higher risk for hepatitis B, you should be screened for this virus. Talk with your health care provider to find out if you are at risk for hepatitis B infection. Hepatitis C Testing is recommended for:  Everyone born from 1945 through 1965.  Anyone with known risk factors for hepatitis C. Sexually transmitted infections (STIs)  Get screened for STIs, including gonorrhea and chlamydia, if: ? You are sexually active and are younger than 45 years of age. ? You are older than 45 years of age and your health care provider tells you that you are at risk for this type of infection. ? Your sexual activity has changed since you were last screened, and you are at increased risk for chlamydia or gonorrhea. Ask your health care provider if   you are at risk.  Ask your health care provider about whether you are at high risk for HIV. Your health care provider may recommend a prescription medicine to help prevent HIV infection. If you choose to take medicine to prevent HIV, you should first get tested for HIV. You should then be tested every 3 months for as long as you are taking the medicine. Pregnancy  If you are about to stop having your period (premenopausal) and  you may become pregnant, seek counseling before you get pregnant.  Take 400 to 800 micrograms (mcg) of folic acid every day if you become pregnant.  Ask for birth control (contraception) if you want to prevent pregnancy. Osteoporosis and menopause Osteoporosis is a disease in which the bones lose minerals and strength with aging. This can result in bone fractures. If you are 65 years old or older, or if you are at risk for osteoporosis and fractures, ask your health care provider if you should:  Be screened for bone loss.  Take a calcium or vitamin D supplement to lower your risk of fractures.  Be given hormone replacement therapy (HRT) to treat symptoms of menopause. Follow these instructions at home: Lifestyle  Do not use any products that contain nicotine or tobacco, such as cigarettes, e-cigarettes, and chewing tobacco. If you need help quitting, ask your health care provider.  Do not use street drugs.  Do not share needles.  Ask your health care provider for help if you need support or information about quitting drugs. Alcohol use  Do not drink alcohol if: ? Your health care provider tells you not to drink. ? You are pregnant, may be pregnant, or are planning to become pregnant.  If you drink alcohol: ? Limit how much you use to 0-1 drink a day. ? Limit intake if you are breastfeeding.  Be aware of how much alcohol is in your drink. In the [place] one drink equals one 12 oz bottle of beer (355 mL), one 5 oz glass of wine (148 mL), or one 1 oz glass of hard liquor (44 mL). General instructions  Schedule regular health, dental, and eye exams.  Stay current with your vaccines.  Tell your health care provider if: ? You often feel depressed. ? You have ever been abused or do not feel safe at home. Summary  Adopting a healthy lifestyle and getting preventive care are important in promoting health and wellness.  Follow your health care provider's instructions about healthy  diet, exercising, and getting tested or screened for diseases.  Follow your health care provider's instructions on monitoring your cholesterol and blood pressure. This information is not intended to replace advice given to you by your health care provider. Make sure you discuss any questions you have with your health care provider. Document Revised: 02/04/2018 Document Reviewed: 02/04/2018 Elsevier Patient Education  2020 Elsevier Inc.  

## 2019-08-10 NOTE — Progress Notes (Deleted)
Subjective:    Patient ID: Christine Turner, female    DOB: 1975/11/28, 44 y.o.   MRN: [id number]  HPI  Pt presents to the clinic today for her annual exam. She is also due to follow up chronic conditions.  Anxiety: Intermittent. Triggered by. She takes Hydroxyzine as needed with good relief of symptoms. She is not seeing a therapist. She denies depression, SI/HI.  Anemia: Her last H/H was 9.7/30.4, 03/2019.  Flu: 12/2018 Tetanus: 07/2015 Covid: never Pap Smear: 12/2017 Mammogram: 03/2019 Colon Screening: never Vision Screening: Dentist:  Diet: Exercise:  Review of Systems      Past Medical History:  Diagnosis Date  . Anemia   . Thalassemia trait     Current Outpatient Medications  Medication Sig Dispense Refill  . cetirizine (ZYRTEC) 10 MG tablet Take 10 mg by mouth daily.    . Coenzyme Q10 (COQ10) 200 MG CAPS Take 2 capsules by mouth.    . diphenhydrAMINE (BENADRYL) 25 mg capsule Take 25 mg by mouth every 6 (six) hours as needed.    . hydrOXYzine (ATARAX/VISTARIL) 10 MG tablet Take 1 tablet (10 mg total) by mouth every 8 (eight) hours as needed. 60 tablet 0  . Melatonin 5 MG CAPS Take by mouth.    . Prenatal Vit-Fe Fumarate-FA (PRENATAL MULTIVITAMIN) TABS tablet Take 1 tablet by mouth daily at 12 noon.     No current facility-administered medications for this visit.    Allergies  Allergen Reactions  . Almond (Diagnostic) Anaphylaxis and Swelling  . Fish Allergy Anaphylaxis  . Penicillins Hives and Swelling    Has patient had a PCN reaction causing immediate rash, facial/tongue/throat swelling, SOB or lightheadedness with hypotension: Unknown Has patient had a PCN reaction causing severe rash involving mucus membranes or skin necrosis: No Has patient had a PCN reaction that required hospitalization No Has patient had a PCN reaction occurring within the last 10 years: No If all of the above answers are "NO", then may proceed with Cephalosporin use.   Marland Kitchen Carrot  [Daucus Carota] Itching and Swelling    Swelling and itching is of the throat.    Family History  Problem Relation Age of Onset  . Hypertension Mother   . Diabetes Father   . Hypertension Father     Social History   Socioeconomic History  . Marital status: Married    Spouse name: Not on file  . Number of children: Not on file  . Years of education: Not on file  . Highest education level: Not on file  Occupational History  . Not on file  Tobacco Use  . Smoking status: Never Smoker  . Smokeless tobacco: Never Used  Vaping Use  . Vaping Use: Never used  Substance and Sexual Activity  . Alcohol use: Yes    Comment: occasional  . Drug use: No  . Sexual activity: Yes    Birth control/protection: I.U.D.  Other Topics Concern  . Not on file  Social History Narrative  . Not on file   Social Determinants of Health   Financial Resource Strain:   . Difficulty of Paying Living Expenses:   Food Insecurity:   . Worried About Programme researcher, broadcasting/film/video in the Last Year:   . Barista in the Last Year:   Transportation Needs:   . Freight forwarder (Medical):   Marland Kitchen Lack of Transportation (Non-Medical):   Physical Activity:   . Days of Exercise per Week:   . Minutes of Exercise  per Session:   Stress:   . Feeling of Stress :   Social Connections:   . Frequency of Communication with Friends and Family:   . Frequency of Social Gatherings with Friends and Family:   . Attends Religious Services:   . Active Member of Clubs or Organizations:   . Attends Banker Meetings:   Marland Kitchen Marital Status:   Intimate Partner Violence:   . Fear of Current or Ex-Partner:   . Emotionally Abused:   Marland Kitchen Physically Abused:   . Sexually Abused:      Constitutional: Denies fever, malaise, fatigue, headache or abrupt weight changes.  HEENT: Denies eye pain, eye redness, ear pain, ringing in the ears, wax buildup, runny nose, nasal congestion, bloody nose, or sore throat. Respiratory:  Denies difficulty breathing, shortness of breath, cough or sputum production.   Cardiovascular: Denies chest pain, chest tightness, palpitations or swelling in the hands or feet.  Gastrointestinal: Denies abdominal pain, bloating, constipation, diarrhea or blood in the stool.  GU: Denies urgency, frequency, pain with urination, burning sensation, blood in urine, odor or discharge. Musculoskeletal: Denies decrease in range of motion, difficulty with gait, muscle pain or joint pain and swelling.  Skin: Denies redness, rashes, lesions or ulcercations.  Neurological: Denies dizziness, difficulty with memory, difficulty with speech or problems with balance and coordination.  Psych: Denies anxiety, depression, SI/HI.  No other specific complaints in a complete review of systems (except as listed in HPI above).  Objective:   Physical Exam   There were no vitals taken for this visit. Wt Readings from Last 3 Encounters:  03/29/19 130 lb (59 kg)  01/20/18 125 lb (56.7 kg)  12/12/17 129 lb (58.5 kg)    General: Appears their stated age, well developed, well nourished in NAD. Skin: Warm, dry and intact. No rashes, lesions or ulcerations noted. HEENT: Head: normal shape and size; Eyes: sclera white, no icterus, conjunctiva pink, PERRLA and EOMs intact; Ears: Tm's gray and intact, normal light reflex; Nose: mucosa pink and moist, septum midline; Throat/Mouth: Teeth present, mucosa pink and moist, no exudate, lesions or ulcerations noted.  Neck:  Neck supple, trachea midline. No masses, lumps or thyromegaly present.  Cardiovascular: Normal rate and rhythm. S1,S2 noted.  No murmur, rubs or gallops noted. No JVD or BLE edema. No carotid bruits noted. Pulmonary/Chest: Normal effort and positive vesicular breath sounds. No respiratory distress. No wheezes, rales or ronchi noted.  Abdomen: Soft and nontender. Normal bowel sounds. No distention or masses noted. Liver, spleen and kidneys non  palpable. Musculoskeletal: Normal range of motion. No signs of joint swelling. No difficulty with gait.  Neurological: Alert and oriented. Cranial nerves II-XII grossly intact. Coordination normal.  Psychiatric: Mood and affect normal. Behavior is normal. Judgment and thought content normal.   EKG:  BMET    Component Value Date/Time   NA 140 04/08/2018 1018   K 4.0 04/08/2018 1018   CL 105 04/08/2018 1018   CO2 21 04/08/2018 1018   GLUCOSE 87 04/08/2018 1018   BUN 8 04/08/2018 1018   CREATININE 0.86 04/08/2018 1018   CALCIUM 8.8 04/08/2018 1018   GFRNONAA 83 04/08/2018 1018   GFRAA 96 04/08/2018 1018    Lipid Panel     Component Value Date/Time   CHOL 84 (L) 04/08/2018 1018   TRIG 63 04/08/2018 1018   HDL 29 (L) 04/08/2018 1018   CHOLHDL 2.9 04/08/2018 1018   LDLCALC 42 04/08/2018 1018    CBC    Component  Value Date/Time   WBC 6.7 03/29/2019 1649   WBC 16.7 (H) 11/07/2015 1525   RBC 4.31 03/29/2019 1649   RBC 3.92 11/07/2015 1525   HGB 9.7 (L) 03/29/2019 1649   HCT 30.4 (L) 03/29/2019 1649   PLT 146 (L) 03/29/2019 1649   MCV 71 (L) 03/29/2019 1649   MCH 22.5 (L) 03/29/2019 1649   MCH 25.0 (L) 11/07/2015 1525   MCHC 31.9 03/29/2019 1649   MCHC 33.2 11/07/2015 1525   RDW 22.6 (H) 03/29/2019 1649    Hgb A1C Lab Results  Component Value Date   HGBA1C <4.2 (L) 04/08/2018           Assessment & Plan:   Preventative Health Maintenance:  Encouraged her to get a flu shot in the fall Tetanus UTD Covid Pap smear UTD Mammogram UTD Referral to GI for screening colonoscopy Encouraged her to consume a balanced diet and exercise regimen Advised her to see an eye doctor and dentist annually Will check CBC, CMET, Lipid profile today  RTC in 1 year, sooner if needed Webb Silversmith, NP This visit occurred during the SARS-CoV-2 public health emergency.  Safety protocols were in place, including screening questions prior to the visit, additional usage of staff  PPE, and extensive cleaning of exam room while observing appropriate contact time as indicated for disinfecting solutions.

## 2019-08-10 NOTE — Progress Notes (Signed)
Patient ID: Christine Turner, female    DOB: [birth year], 44 y.o.   MRN: [id number]  HPI  Pt presents to the clinic today for her annual exam. She is also due to follow up chronic conditions.  Anxiety: Intermittent. Triggered by general life stress. She does feel like this has deteroirated and would like FMLA to take 2 weeks off work. She takes Hydroxyzine and Delta 8 gummies as needed with good relief of symptoms. She is not seeing a therapist. She denies depression, SI/HI.  Anemia: Her last H/H was 9.7/30.4, 03/2019. She is taking Prenatal vitamins with iron. She is not seeing a hematologist.  She would like a TSH checked today due to family history of thyroid disorders.  She would also like RX for Retin A for acne. She has been on this in the past and reports wearing the mask has caused and increase in acne.   Flu: 12/2018 Tetanus: 07/2015 Covid: 05/2019, 06/2019 Pap Smear: 12/2017 Mammogram: 03/2019 Colon Screening: never Vision Screening: annually Dentist: biannually  Diet: She does eat meat. She consumes fruits and veggies daily. She tries to avoid fried foods. She drinks mostly hot tea, soda, some water. Exercise:  Review of Systems      Past Medical History:  Diagnosis Date  . Anemia   . Thalassemia trait     Current Outpatient Medications  Medication Sig Dispense Refill  . cetirizine (ZYRTEC) 10 MG tablet Take 10 mg by mouth daily.    . Coenzyme Q10 (COQ10) 200 MG CAPS Take 2 capsules by mouth.    . diphenhydrAMINE (BENADRYL) 25 mg capsule Take 25 mg by mouth every 6 (six) hours as needed.    . hydrOXYzine (ATARAX/VISTARIL) 10 MG tablet Take 1 tablet (10 mg total) by mouth every 8 (eight) hours as needed. 60 tablet 0  . Melatonin 5 MG CAPS Take by mouth.    . Prenatal Vit-Fe Fumarate-FA (PRENATAL MULTIVITAMIN) TABS tablet Take 1 tablet by mouth daily at 12 noon.     No current facility-administered medications for this visit.    Allergies  Allergen Reactions  . Almond  (Diagnostic) Anaphylaxis and Swelling  . Fish Allergy Anaphylaxis  . Penicillins Hives and Swelling    Has patient had a PCN reaction causing immediate rash, facial/tongue/throat swelling, SOB or lightheadedness with hypotension: Unknown Has patient had a PCN reaction causing severe rash involving mucus membranes or skin necrosis: No Has patient had a PCN reaction that required hospitalization No Has patient had a PCN reaction occurring within the last 10 years: No If all of the above answers are "NO", then may proceed with Cephalosporin use.   Marland Kitchen Carrot [Daucus Carota] Itching and Swelling    Swelling and itching is of the throat.    Family History  Problem Relation Age of Onset  . Hypertension Mother   . Diabetes Father   . Hypertension Father     Social History   Socioeconomic History  . Marital status: Married    Spouse name: Not on file  . Number of children: Not on file  . Years of education: Not on file  . Highest education level: Not on file  Occupational History  . Not on file  Tobacco Use  . Smoking status: Never Smoker  . Smokeless tobacco: Never Used  Vaping Use  . Vaping Use: Never used  Substance and Sexual Activity  . Alcohol use: Yes    Comment: occasional  . Drug use: No  . Sexual activity: Yes  Birth control/protection: I.U.D.  Other Topics Concern  . Not on file  Social History Narrative  . Not on file   Social Determinants of Health   Financial Resource Strain:   . Difficulty of Paying Living Expenses:   Food Insecurity:   . Worried About Programme researcher, broadcasting/film/video in the Last Year:   . Barista in the Last Year:   Transportation Needs:   . Freight forwarder (Medical):   Marland Kitchen Lack of Transportation (Non-Medical):   Physical Activity:   . Days of Exercise per Week:   . Minutes of Exercise per Session:   Stress:   . Feeling of Stress :   Social Connections:   . Frequency of Communication with Friends and Family:   . Frequency of  Social Gatherings with Friends and Family:   . Attends Religious Services:   . Active Member of Clubs or Organizations:   . Attends Banker Meetings:   Marland Kitchen Marital Status:   Intimate Partner Violence:   . Fear of Current or Ex-Partner:   . Emotionally Abused:   Marland Kitchen Physically Abused:   . Sexually Abused:      Constitutional: Denies fever, malaise, fatigue, headache or abrupt weight changes.  HEENT: Denies eye pain, eye redness, ear pain, ringing in the ears, wax buildup, runny nose, nasal congestion, bloody nose, or sore throat. Respiratory: Denies difficulty breathing, shortness of breath, cough or sputum production.   Cardiovascular: Denies chest pain, chest tightness, palpitations or swelling in the hands or feet.  Gastrointestinal: Denies abdominal pain, bloating, constipation, diarrhea or blood in the stool.  GU: Denies urgency, frequency, pain with urination, burning sensation, blood in urine, odor or discharge. Musculoskeletal: Denies decrease in range of motion, difficulty with gait, muscle pain or joint pain and swelling.  Skin: Pt reports acne on face. Denies redness, rashes, or ulcercations.  Neurological: Denies dizziness, difficulty with memory, difficulty with speech or problems with balance and coordination.  Psych: Pt reports anxiety. Denies depression, SI/HI.  No other specific complaints in a complete review of systems (except as listed in HPI above).  Objective:   Physical Exam  BP 104/68   Pulse 74   Temp 98.1 F (36.7 C) (Temporal)   Ht 5\' 4"  (1.626 m)   Wt 124 lb (56.2 kg)   SpO2 98%   BMI 21.28 kg/m   Wt Readings from Last 3 Encounters:  03/29/19 130 lb (59 kg)  01/20/18 125 lb (56.7 kg)  12/12/17 129 lb (58.5 kg)    General: Appears her stated age, well developed, well nourished in NAD. Skin: Warm, dry and intact. No rashes noted. HEENT: Head: normal shape and size; Eyes: sclera white, no icterus, conjunctiva pink, PERRLA and EOMs  intact;  Neck:  Neck supple, trachea midline. No masses, lumps or thyromegaly present.  Cardiovascular: Normal rate and rhythm. S1,S2 noted.  No murmur, rubs or gallops noted. No JVD or BLE edema. Pulmonary/Chest: Normal effort and positive vesicular breath sounds. No respiratory distress. No wheezes, rales or ronchi noted.  Abdomen: Soft and nontender. Normal bowel sounds. No distention or masses noted. Liver, spleen and kidneys non palpable. Musculoskeletal: Strength 5/5 BUE/BLE. No difficulty with gait.  Neurological: Alert and oriented. Cranial nerves II-XII grossly intact. Coordination normal.  Psychiatric: Mood and affect normal. Behavior is normal. Judgment and thought content normal.    BMET    Component Value Date/Time   NA 140 04/08/2018 1018   K 4.0 04/08/2018 1018  CL 105 04/08/2018 1018   CO2 21 04/08/2018 1018   GLUCOSE 87 04/08/2018 1018   BUN 8 04/08/2018 1018   CREATININE 0.86 04/08/2018 1018   CALCIUM 8.8 04/08/2018 1018   GFRNONAA 83 04/08/2018 1018   GFRAA 96 04/08/2018 1018    Lipid Panel     Component Value Date/Time   CHOL 84 (L) 04/08/2018 1018   TRIG 63 04/08/2018 1018   HDL 29 (L) 04/08/2018 1018   CHOLHDL 2.9 04/08/2018 1018   LDLCALC 42 04/08/2018 1018    CBC    Component Value Date/Time   WBC 6.7 03/29/2019 1649   WBC 16.7 (H) 11/07/2015 1525   RBC 4.31 03/29/2019 1649   RBC 3.92 11/07/2015 1525   HGB 9.7 (L) 03/29/2019 1649   HCT 30.4 (L) 03/29/2019 1649   PLT 146 (L) 03/29/2019 1649   MCV 71 (L) 03/29/2019 1649   MCH 22.5 (L) 03/29/2019 1649   MCH 25.0 (L) 11/07/2015 1525   MCHC 31.9 03/29/2019 1649   MCHC 33.2 11/07/2015 1525   RDW 22.6 (H) 03/29/2019 1649    Hgb A1C Lab Results  Component Value Date   HGBA1C <4.2 (L) 04/08/2018           Assessment & Plan:   Preventative Health Maintenance:  Encouraged her to get a flu shot in the fall Tetanus UTD Covid UTD Pap smear UTD Mammogram UTD Referral to GI for  screening colonoscopy Encouraged her to consume a balanced diet and exercise regimen Advised her to see an eye doctor and dentist annually Will check CBC, CMET, Lipid profile and Vit D today  Family History of Thyroid Disorder:  TSH today  RTC in 1 year, sooner if needed Webb Silversmith, NP This visit occurred during the SARS-CoV-2 public health emergency.  Safety protocols were in place, including screening questions prior to the visit, additional usage of staff PPE, and extensive cleaning of exam room while observing appropriate contact time as indicated for disinfecting solutions.

## 2019-08-10 NOTE — Assessment & Plan Note (Signed)
Deteriorated She will fax FMLA forms for completion for 2-week LOA. Continue Hydroxyzine and delta 8 Gummies as needed Support offered today We will monitor

## 2019-08-10 NOTE — Assessment & Plan Note (Signed)
CBC today Continue prenatal vitamins with iron

## 2019-08-11 ENCOUNTER — Encounter: Payer: Self-pay | Admitting: Gastroenterology

## 2019-08-13 ENCOUNTER — Encounter: Payer: Self-pay | Admitting: Internal Medicine

## 2019-08-13 DIAGNOSIS — E559 Vitamin D deficiency, unspecified: Secondary | ICD-10-CM

## 2019-08-13 MED ORDER — VITAMIN D (ERGOCALCIFEROL) 1.25 MG (50000 UNIT) PO CAPS
50000.0000 [IU] | ORAL_CAPSULE | ORAL | 0 refills | Status: DC
Start: 2019-08-13 — End: 2020-02-26

## 2019-09-10 ENCOUNTER — Telehealth: Payer: Self-pay

## 2019-09-10 NOTE — Telephone Encounter (Signed)
Patient states that she was seen on 6/15 for her physical, and that her anxiety and depression was discussed, and show she was needing to take time off work. She states Rene Kocher stated to send a copy of any paperwork she would need filled out. Patient states her employer does not need need any paperwork filled out - they will just need a letter with patients full name, reason why off work, & specific amount of days off. After speaking with Shawna Orleans, patient will need to be seen for an 30 min appt to discuss this since it is an extended leave/FMLA issue. Patient has been notified of this and appt has been scheduled.

## 2019-09-14 ENCOUNTER — Ambulatory Visit: Payer: BC Managed Care – PPO | Admitting: Internal Medicine

## 2019-09-14 DIAGNOSIS — Z0289 Encounter for other administrative examinations: Secondary | ICD-10-CM

## 2019-09-14 NOTE — Progress Notes (Deleted)
Subjective:    Patient ID: Christine Turner, female    DOB: 10/19/1975, 44 y.o.   MRN: [id number]  HPI  Patient presents the clinic today requesting FMLA forms.  Review of Systems      Past Medical History:  Diagnosis Date  . Anemia   . Thalassemia trait     Current Outpatient Medications  Medication Sig Dispense Refill  . cetirizine (ZYRTEC) 10 MG tablet Take 10 mg by mouth daily.    . Coenzyme Q10 (COQ10) 200 MG CAPS Take 2 capsules by mouth.    . diphenhydrAMINE (BENADRYL) 25 mg capsule Take 25 mg by mouth every 6 (six) hours as needed.    . hydrOXYzine (ATARAX/VISTARIL) 10 MG tablet Take 1 tablet (10 mg total) by mouth every 8 (eight) hours as needed. 60 tablet 0  . Melatonin 5 MG CAPS Take by mouth.    . Prenatal Vit-Fe Fumarate-FA (PRENATAL MULTIVITAMIN) TABS tablet Take 1 tablet by mouth daily at 12 noon.    . tretinoin (RETIN-A) 0.025 % cream Apply topically at bedtime. 45 g 0  . Vitamin D, Ergocalciferol, (DRISDOL) 1.25 MG (50000 UNIT) CAPS capsule Take 1 capsule (50,000 Units total) by mouth every 7 (seven) days. 12 capsule 0   No current facility-administered medications for this visit.    Allergies  Allergen Reactions  . Almond (Diagnostic) Anaphylaxis and Swelling  . Fish Allergy Anaphylaxis  . Penicillins Hives and Swelling    Has patient had a PCN reaction causing immediate rash, facial/tongue/throat swelling, SOB or lightheadedness with hypotension: Unknown Has patient had a PCN reaction causing severe rash involving mucus membranes or skin necrosis: No Has patient had a PCN reaction that required hospitalization No Has patient had a PCN reaction occurring within the last 10 years: No If all of the above answers are "NO", then may proceed with Cephalosporin use.   Marland Kitchen Carrot [Daucus Carota] Itching and Swelling    Swelling and itching is of the throat.    Family History  Problem Relation Age of Onset  . Hypertension Mother   . Diabetes Father   .  Hypertension Father     Social History   Socioeconomic History  . Marital status: Married    Spouse name: Not on file  . Number of children: Not on file  . Years of education: Not on file  . Highest education level: Not on file  Occupational History  . Not on file  Tobacco Use  . Smoking status: Never Smoker  . Smokeless tobacco: Never Used  Vaping Use  . Vaping Use: Never used  Substance and Sexual Activity  . Alcohol use: Yes    Comment: occasional  . Drug use: No  . Sexual activity: Yes    Birth control/protection: I.U.D.  Other Topics Concern  . Not on file  Social History Narrative  . Not on file   Social Determinants of Health   Financial Resource Strain:   . Difficulty of Paying Living Expenses:   Food Insecurity:   . Worried About Programme researcher, broadcasting/film/video in the Last Year:   . Barista in the Last Year:   Transportation Needs:   . Freight forwarder (Medical):   Marland Kitchen Lack of Transportation (Non-Medical):   Physical Activity:   . Days of Exercise per Week:   . Minutes of Exercise per Session:   Stress:   . Feeling of Stress :   Social Connections:   . Frequency of Communication with Friends  and Family:   . Frequency of Social Gatherings with Friends and Family:   . Attends Religious Services:   . Active Member of Clubs or Organizations:   . Attends Banker Meetings:   Marland Kitchen Marital Status:   Intimate Partner Violence:   . Fear of Current or Ex-Partner:   . Emotionally Abused:   Marland Kitchen Physically Abused:   . Sexually Abused:      Constitutional: Denies fever, malaise, fatigue, headache or abrupt weight changes.  HEENT: Denies eye pain, eye redness, ear pain, ringing in the ears, wax buildup, runny nose, nasal congestion, bloody nose, or sore throat. Respiratory: Denies difficulty breathing, shortness of breath, cough or sputum production.   Cardiovascular: Denies chest pain, chest tightness, palpitations or swelling in the hands or feet.   Gastrointestinal: Denies abdominal pain, bloating, constipation, diarrhea or blood in the stool.  GU: Denies urgency, frequency, pain with urination, burning sensation, blood in urine, odor or discharge. Musculoskeletal: Denies decrease in range of motion, difficulty with gait, muscle pain or joint pain and swelling.  Skin: Denies redness, rashes, lesions or ulcercations.  Neurological: Denies dizziness, difficulty with memory, difficulty with speech or problems with balance and coordination.  Psych: Denies anxiety, depression, SI/HI.  No other specific complaints in a complete review of systems (except as listed in HPI above).  Objective:   Physical Exam    There were no vitals taken for this visit. Wt Readings from Last 3 Encounters:  08/10/19 124 lb (56.2 kg)  03/29/19 130 lb (59 kg)  01/20/18 125 lb (56.7 kg)    General: Appears their stated age, well developed, well nourished in NAD. Skin: Warm, dry and intact. No rashes, lesions or ulcerations noted. HEENT: Head: normal shape and size; Eyes: sclera white, no icterus, conjunctiva pink, PERRLA and EOMs intact; Ears: Tm's gray and intact, normal light reflex; Nose: mucosa pink and moist, septum midline; Throat/Mouth: Teeth present, mucosa pink and moist, no exudate, lesions or ulcerations noted.  Neck:  Neck supple, trachea midline. No masses, lumps or thyromegaly present.  Cardiovascular: Normal rate and rhythm. S1,S2 noted.  No murmur, rubs or gallops noted. No JVD or BLE edema. No carotid bruits noted. Pulmonary/Chest: Normal effort and positive vesicular breath sounds. No respiratory distress. No wheezes, rales or ronchi noted.  Abdomen: Soft and nontender. Normal bowel sounds. No distention or masses noted. Liver, spleen and kidneys non palpable. Musculoskeletal: Normal range of motion. No signs of joint swelling. No difficulty with gait.  Neurological: Alert and oriented. Cranial nerves II-XII grossly intact. Coordination  normal.  Psychiatric: Mood and affect normal. Behavior is normal. Judgment and thought content normal.   EKG:  BMET    Component Value Date/Time   NA 137 08/10/2019 1223   NA 140 04/08/2018 1018   K 3.7 08/10/2019 1223   CL 107 08/10/2019 1223   CO2 26 08/10/2019 1223   GLUCOSE 80 08/10/2019 1223   BUN 11 08/10/2019 1223   BUN 8 04/08/2018 1018   CREATININE 1.04 08/10/2019 1223   CALCIUM 9.0 08/10/2019 1223   GFRNONAA 83 04/08/2018 1018   GFRAA 96 04/08/2018 1018    Lipid Panel     Component Value Date/Time   CHOL 69 08/10/2019 1223   CHOL 84 (L) 04/08/2018 1018   TRIG 57.0 08/10/2019 1223   HDL 27.40 (L) 08/10/2019 1223   HDL 29 (L) 04/08/2018 1018   CHOLHDL 3 08/10/2019 1223   VLDL 11.4 08/10/2019 1223   LDLCALC 30 08/10/2019 1223  LDLCALC 42 04/08/2018 1018    CBC    Component Value Date/Time   WBC 6.9 08/10/2019 1223   RBC 4.29 08/10/2019 1223   HGB 9.8 (L) 08/10/2019 1223   HGB 9.7 (L) 03/29/2019 1649   HCT 29.8 (L) 08/10/2019 1223   HCT 30.4 (L) 03/29/2019 1649   PLT 172.0 08/10/2019 1223   PLT 146 (L) 03/29/2019 1649   MCV 69.4 Repeated and verified X2. (L) 08/10/2019 1223   MCV 71 (L) 03/29/2019 1649   MCH 22.5 (L) 03/29/2019 1649   MCH 25.0 (L) 11/07/2015 1525   MCHC 32.8 08/10/2019 1223   RDW 21.8 (H) 08/10/2019 1223   RDW 22.6 (H) 03/29/2019 1649    Hgb A1C Lab Results  Component Value Date   HGBA1C <4.2 (L) 04/08/2018          Assessment & Plan:   Nicki Reaper, NP This visit occurred during the SARS-CoV-2 public health emergency.  Safety protocols were in place, including screening questions prior to the visit, additional usage of staff PPE, and extensive cleaning of exam room while observing appropriate contact time as indicated for disinfecting solutions.

## 2019-09-15 ENCOUNTER — Telehealth: Payer: Self-pay

## 2019-09-15 NOTE — Telephone Encounter (Signed)
Pt is aware FMLA PPW will be faxed, copy sent to scan and original mailed to pt  Pt did state she had an upcoming appt with a new practice to see if she like them in the meantime and will let me know if she decides to stay with them as they are located closer to her home

## 2019-09-20 ENCOUNTER — Other Ambulatory Visit: Payer: Self-pay

## 2019-09-21 ENCOUNTER — Encounter: Payer: Self-pay | Admitting: Internal Medicine

## 2019-09-21 ENCOUNTER — Ambulatory Visit: Payer: BC Managed Care – PPO | Admitting: Internal Medicine

## 2019-09-21 VITALS — BP 108/60 | HR 82 | Temp 98.1°F | Ht 64.0 in | Wt 122.0 lb

## 2019-09-21 DIAGNOSIS — F419 Anxiety disorder, unspecified: Secondary | ICD-10-CM | POA: Diagnosis not present

## 2019-09-21 DIAGNOSIS — F5104 Psychophysiologic insomnia: Secondary | ICD-10-CM | POA: Diagnosis not present

## 2019-09-21 DIAGNOSIS — F329 Major depressive disorder, single episode, unspecified: Secondary | ICD-10-CM | POA: Diagnosis not present

## 2019-09-21 DIAGNOSIS — F32A Depression, unspecified: Secondary | ICD-10-CM

## 2019-09-21 DIAGNOSIS — G4700 Insomnia, unspecified: Secondary | ICD-10-CM | POA: Insufficient documentation

## 2019-09-21 MED ORDER — TRAZODONE HCL 50 MG PO TABS: 25.0000 mg | ORAL_TABLET | Freq: Every evening | ORAL | 2 refills | Status: DC | PRN

## 2019-09-21 MED ORDER — EPINEPHRINE 0.3 MG/0.3ML IJ SOAJ: 0.3000 mg | INTRAMUSCULAR | 0 refills | Status: DC | PRN

## 2019-09-21 NOTE — Progress Notes (Signed)
Subjective:    Patient ID: Christine Turner, female    DOB: 1975/12/20, 44 y.o.   MRN: [id number]  HPI  Pt presents to the clinic today to follow up anxiety, depression and insomnia. She is having trouble falling asleep and staying asleep. She feels like she is having trouble sleeping secondary to anxiety. A lot of her anxiety comes from her job, she is currently in the process of finding a new job. She also has some stress at home, her youngest child with autism keeps getting bounced around to different daycares due to the labor shortage. She is not currently seeing a therapist but would be interested in this. She has tried Delta 8, Hydroxyzine and Benadryl.  She recently took FMLA for the same for 2 weeks. She plans to take an additional leave the last 2 weeks of August.  Review of Systems      Past Medical History:  Diagnosis Date  . Anemia   . Thalassemia trait     Current Outpatient Medications  Medication Sig Dispense Refill  . cetirizine (ZYRTEC) 10 MG tablet Take 10 mg by mouth daily.    . Coenzyme Q10 (COQ10) 200 MG CAPS Take 2 capsules by mouth.    . diphenhydrAMINE (BENADRYL) 25 mg capsule Take 25 mg by mouth every 6 (six) hours as needed.    . hydrOXYzine (ATARAX/VISTARIL) 10 MG tablet Take 1 tablet (10 mg total) by mouth every 8 (eight) hours as needed. 60 tablet 0  . Melatonin 5 MG CAPS Take by mouth.    . Prenatal Vit-Fe Fumarate-FA (PRENATAL MULTIVITAMIN) TABS tablet Take 1 tablet by mouth daily at 12 noon.    . tretinoin (RETIN-A) 0.025 % cream Apply topically at bedtime. 45 g 0  . Vitamin D, Ergocalciferol, (DRISDOL) 1.25 MG (50000 UNIT) CAPS capsule Take 1 capsule (50,000 Units total) by mouth every 7 (seven) days. 12 capsule 0   No current facility-administered medications for this visit.    Allergies  Allergen Reactions  . Almond (Diagnostic) Anaphylaxis and Swelling  . Fish Allergy Anaphylaxis  . Penicillins Hives and Swelling    Has patient had a PCN  reaction causing immediate rash, facial/tongue/throat swelling, SOB or lightheadedness with hypotension: Unknown Has patient had a PCN reaction causing severe rash involving mucus membranes or skin necrosis: No Has patient had a PCN reaction that required hospitalization No Has patient had a PCN reaction occurring within the last 10 years: No If all of the above answers are "NO", then may proceed with Cephalosporin use.   Marland Kitchen Carrot [Daucus Carota] Itching and Swelling    Swelling and itching is of the throat.    Family History  Problem Relation Age of Onset  . Hypertension Mother   . Diabetes Father   . Hypertension Father     Social History   Socioeconomic History  . Marital status: Married    Spouse name: Not on file  . Number of children: Not on file  . Years of education: Not on file  . Highest education level: Not on file  Occupational History  . Not on file  Tobacco Use  . Smoking status: Never Smoker  . Smokeless tobacco: Never Used  Vaping Use  . Vaping Use: Never used  Substance and Sexual Activity  . Alcohol use: Yes    Comment: occasional  . Drug use: No  . Sexual activity: Yes    Birth control/protection: I.U.D.  Other Topics Concern  . Not on file  Social  History Narrative  . Not on file   Social Determinants of Health   Financial Resource Strain:   . Difficulty of Paying Living Expenses:   Food Insecurity:   . Worried About Programme researcher, broadcasting/film/video in the Last Year:   . Barista in the Last Year:   Transportation Needs:   . Freight forwarder (Medical):   Marland Kitchen Lack of Transportation (Non-Medical):   Physical Activity:   . Days of Exercise per Week:   . Minutes of Exercise per Session:   Stress:   . Feeling of Stress :   Social Connections:   . Frequency of Communication with Friends and Family:   . Frequency of Social Gatherings with Friends and Family:   . Attends Religious Services:   . Active Member of Clubs or Organizations:   .  Attends Banker Meetings:   Marland Kitchen Marital Status:   Intimate Partner Violence:   . Fear of Current or Ex-Partner:   . Emotionally Abused:   Marland Kitchen Physically Abused:   . Sexually Abused:      Constitutional: Denies fever, malaise, fatigue, headache or abrupt weight changes.  Respiratory: Denies difficulty breathing, shortness of breath, cough or sputum production.   Cardiovascular: Denies chest pain, chest tightness, palpitations or swelling in the hands or feet.  Neurological: Pt reports insomnia. Denies dizziness, difficulty with memory, difficulty with speech or problems with balance and coordination.  Psych: Pt reports anxiety, depression. Denies SI/HI.  No other specific complaints in a complete review of systems (except as listed in HPI above).  Objective:   Physical Exam   BP (!) 108/60   Pulse 82   Temp 98.1 F (36.7 C) (Temporal)   Ht 5\' 4"  (1.626 m)   Wt 122 lb (55.3 kg)   SpO2 98%   BMI 20.94 kg/m   Wt Readings from Last 3 Encounters:  08/10/19 124 lb (56.2 kg)  03/29/19 130 lb (59 kg)  01/20/18 125 lb (56.7 kg)    General: Appears her stated age, well developed, well nourished in NAD. Cardiovascular: Normal rate. Pulmonary/Chest: Normal effort. Neurological: Alert and oriented.  Psychiatric: Slightly tearful at times. Behavior is normal. Judgment and thought content normal.    BMET    Component Value Date/Time   NA 137 08/10/2019 1223   NA 140 04/08/2018 1018   K 3.7 08/10/2019 1223   CL 107 08/10/2019 1223   CO2 26 08/10/2019 1223   GLUCOSE 80 08/10/2019 1223   BUN 11 08/10/2019 1223   BUN 8 04/08/2018 1018   CREATININE 1.04 08/10/2019 1223   CALCIUM 9.0 08/10/2019 1223   GFRNONAA 83 04/08/2018 1018   GFRAA 96 04/08/2018 1018    Lipid Panel     Component Value Date/Time   CHOL 69 08/10/2019 1223   CHOL 84 (L) 04/08/2018 1018   TRIG 57.0 08/10/2019 1223   HDL 27.40 (L) 08/10/2019 1223   HDL 29 (L) 04/08/2018 1018   CHOLHDL 3  08/10/2019 1223   VLDL 11.4 08/10/2019 1223   LDLCALC 30 08/10/2019 1223   LDLCALC 42 04/08/2018 1018    CBC    Component Value Date/Time   WBC 6.9 08/10/2019 1223   RBC 4.29 08/10/2019 1223   HGB 9.8 (L) 08/10/2019 1223   HGB 9.7 (L) 03/29/2019 1649   HCT 29.8 (L) 08/10/2019 1223   HCT 30.4 (L) 03/29/2019 1649   PLT 172.0 08/10/2019 1223   PLT 146 (L) 03/29/2019 1649   MCV 69.4  Repeated and verified X2. (L) 08/10/2019 1223   MCV 71 (L) 03/29/2019 1649   MCH 22.5 (L) 03/29/2019 1649   MCH 25.0 (L) 11/07/2015 1525   MCHC 32.8 08/10/2019 1223   RDW 21.8 (H) 08/10/2019 1223   RDW 22.6 (H) 03/29/2019 1649    Hgb A1C Lab Results  Component Value Date   HGBA1C <4.2 (L) 04/08/2018           Assessment & Plan:   Nicki Reaper, NP This visit occurred during the SARS-CoV-2 public health emergency.  Safety protocols were in place, including screening questions prior to the visit, additional usage of staff PPE, and extensive cleaning of exam room while observing appropriate contact time as indicated for disinfecting solutions.

## 2019-09-21 NOTE — Assessment & Plan Note (Signed)
Deteriorated Will trial Trazadone 25-50 mg PO QHS  Update me in 3 weeks and let me know how this is working for you.

## 2019-09-21 NOTE — Assessment & Plan Note (Signed)
Deteriorated Will see if this improves with better sleep, RX for Trazadone QHS Referral to psychology for CBT

## 2019-09-21 NOTE — Patient Instructions (Signed)

## 2019-10-06 NOTE — Telephone Encounter (Signed)
Pt stated her fmla paperwork was denied and she will call her employer and have them fax paperwork and what needs to be corrected To [phone]

## 2019-10-08 ENCOUNTER — Ambulatory Visit (AMBULATORY_SURGERY_CENTER): Payer: Self-pay | Admitting: *Deleted

## 2019-10-08 ENCOUNTER — Encounter: Payer: Self-pay | Admitting: Gastroenterology

## 2019-10-08 ENCOUNTER — Other Ambulatory Visit: Payer: Self-pay

## 2019-10-08 VITALS — Ht 64.0 in | Wt 122.0 lb

## 2019-10-08 DIAGNOSIS — Z1211 Encounter for screening for malignant neoplasm of colon: Secondary | ICD-10-CM

## 2019-10-08 MED ORDER — SUTAB 1479-225-188 MG PO TABS: 1.0000 | ORAL_TABLET | ORAL | 0 refills | Status: DC

## 2019-10-08 NOTE — Telephone Encounter (Signed)
Tried calling pt voicemail full   Need start and end date for fmla

## 2019-10-08 NOTE — Progress Notes (Signed)
Patient does not consent to observer being present during procedure. Patient is here in-person for PV. Patient denies any allergies to eggs or soy. Patient denies any problems with anesthesia/sedation. Patient denies any oxygen use at home. Patient denies taking any diet/weight loss medications or blood thinners. Patient is not being treated for MRSA or C-diff. Patient is aware of our care-partner policy and Covid-19 safety protocol. EMMI education sent via [facility]. COVID-19 vaccines completed per pt.  Prep Prescription coupon given to the patient.

## 2019-10-12 NOTE — Telephone Encounter (Signed)
Done, given back to Robin 

## 2019-10-12 NOTE — Telephone Encounter (Signed)
Spoke with pt she stated she was out of work from 09/09/19 to 09/22/19  Paperwork in [facility]ina[facility] in box for review and signature

## 2019-10-13 ENCOUNTER — Other Ambulatory Visit: Payer: Self-pay | Admitting: Internal Medicine

## 2019-10-21 NOTE — Telephone Encounter (Signed)
Paperwork faxed 8/17  Emily h emailed copy to pt 8/26 Copy for scan

## 2019-10-22 ENCOUNTER — Encounter: Payer: Self-pay | Admitting: Gastroenterology

## 2019-10-22 ENCOUNTER — Other Ambulatory Visit: Payer: Self-pay

## 2019-10-22 ENCOUNTER — Ambulatory Visit (AMBULATORY_SURGERY_CENTER): Payer: BC Managed Care – PPO | Admitting: Gastroenterology

## 2019-10-22 VITALS — BP 113/55 | HR 56 | Temp 97.3°F | Resp 19 | Ht 64.0 in | Wt 122.0 lb

## 2019-10-22 DIAGNOSIS — Z1211 Encounter for screening for malignant neoplasm of colon: Secondary | ICD-10-CM

## 2019-10-22 MED ORDER — SODIUM CHLORIDE 0.9 % IV SOLN
500.0000 mL | Freq: Once | INTRAVENOUS | Status: DC
Start: 2019-10-22 — End: 2019-10-22

## 2019-10-22 NOTE — Progress Notes (Signed)
Pt's states no medical or surgical changes since previsit or office visit.  JK - vitals 

## 2019-10-22 NOTE — Progress Notes (Signed)
pt tolerated well. VSS. awake and to recovery. Report given to RN.  

## 2019-10-22 NOTE — Patient Instructions (Addendum)
Resume previous diet Continue current medications Repeat colonoscopy in 10 years   YOU HAD AN ENDOSCOPIC PROCEDURE TODAY AT THE [facility]:   Refer to the procedure report that was given to you for any specific questions about what was found during the examination.  If the procedure report does not answer your questions, please call your gastroenterologist to clarify.  If you requested that your care partner not be given the details of your procedure findings, then the procedure report has been included in a sealed envelope for you to review at your convenience later.  YOU SHOULD EXPECT: Some feelings of bloating in the abdomen. Passage of more gas than usual.  Walking can help get rid of the air that was put into your GI tract during the procedure and reduce the bloating. If you had a lower endoscopy (such as a colonoscopy or flexible sigmoidoscopy) you may notice spotting of blood in your stool or on the toilet paper. If you underwent a bowel prep for your procedure, you may not have a normal bowel movement for a few days.  Please Note:  You might notice some irritation and congestion in your nose or some drainage.  This is from the oxygen used during your procedure.  There is no need for concern and it should clear up in a day or so.  SYMPTOMS TO REPORT IMMEDIATELY:   Following lower endoscopy (colonoscopy or flexible sigmoidoscopy):  Excessive amounts of blood in the stool  Significant tenderness or worsening of abdominal pains  Swelling of the abdomen that is new, acute  Fever of 100F or higher   For urgent or emergent issues, a gastroenterologist can be reached at any hour by calling [phone]. Do not use MyChart messaging for urgent concerns.    DIET:  We do recommend a small meal at first, but then you may proceed to your regular diet.  Drink plenty of fluids but you should avoid alcoholic beverages for 24 hours.  ACTIVITY:  You should plan to take it easy for  the rest of today and you should NOT DRIVE or use heavy machinery until tomorrow (because of the sedation medicines used during the test).    FOLLOW UP: Our staff will call the number listed on your records 48-72 hours following your procedure to check on you and address any questions or concerns that you may have regarding the information given to you following your procedure. If we do not reach you, we will leave a message.  We will attempt to reach you two times.  During this call, we will ask if you have developed any symptoms of COVID 19. If you develop any symptoms (ie: fever, flu-like symptoms, shortness of breath, cough etc.) before then, please call [phone].  If you test positive for Covid 19 in the 2 weeks post procedure, please call and report this information to [place].    If any biopsies were taken you will be contacted by phone or by letter within the next 1-3 weeks.  Please call us at [phone] if you have not heard about the biopsies in 3 weeks.    SIGNATURES/CONFIDENTIALITY: You and/or your care partner have signed paperwork which will be entered into your electronic medical record.  These signatures attest to the fact that that the information above on your After Visit Summary has been reviewed and is understood.  Full responsibility of the confidentiality of this discharge information lies with you and/or your care-partner.YOU HAD AN ENDOSCOPIC PROCEDURE TODAY  AT THE [facility]:   Refer to the procedure report that was given to you for any specific questions about what was found during the examination.  If the procedure report does not answer your questions, please call your gastroenterologist to clarify.  If you requested that your care partner not be given the details of your procedure findings, then the procedure report has been included in a sealed envelope for you to review at your convenience later.  YOU SHOULD EXPECT: Some feelings of bloating in the abdomen.  Passage of more gas than usual.  Walking can help get rid of the air that was put into your GI tract during the procedure and reduce the bloating. If you had a lower endoscopy (such as a colonoscopy or flexible sigmoidoscopy) you may notice spotting of blood in your stool or on the toilet paper. If you underwent a bowel prep for your procedure, you may not have a normal bowel movement for a few days.  Please Note:  You might notice some irritation and congestion in your nose or some drainage.  This is from the oxygen used during your procedure.  There is no need for concern and it should clear up in a day or so.  SYMPTOMS TO REPORT IMMEDIATELY:   Following lower endoscopy (colonoscopy or flexible sigmoidoscopy):  Excessive amounts of blood in the stool  Significant tenderness or worsening of abdominal pains  Swelling of the abdomen that is new, acute  Fever of 100F or higher   For urgent or emergent issues, a gastroenterologist can be reached at any hour by calling [phone]. Do not use MyChart messaging for urgent concerns.    DIET:  We do recommend a small meal at first, but then you may proceed to your regular diet.  Drink plenty of fluids but you should avoid alcoholic beverages for 24 hours.  ACTIVITY:  You should plan to take it easy for the rest of today and you should NOT DRIVE or use heavy machinery until tomorrow (because of the sedation medicines used during the test).    FOLLOW UP: Our staff will call the number listed on your records 48-72 hours following your procedure to check on you and address any questions or concerns that you may have regarding the information given to you following your procedure. If we do not reach you, we will leave a message.  We will attempt to reach you two times.  During this call, we will ask if you have developed any symptoms of COVID 19. If you develop any symptoms (ie: fever, flu-like symptoms, shortness of breath, cough etc.) before then,  please call [phone].  If you test positive for Covid 19 in the 2 weeks post procedure, please call and report this information to [place].    If any biopsies were taken you will be contacted by phone or by letter within the next 1-3 weeks.  Please call us at [phone] if you have not heard about the biopsies in 3 weeks.    SIGNATURES/CONFIDENTIALITY: You and/or your care partner have signed paperwork which will be entered into your electronic medical record.  These signatures attest to the fact that that the information above on your After Visit Summary has been reviewed and is understood.  Full responsibility of the confidentiality of this discharge information lies with you and/or your care-partner.

## 2019-10-22 NOTE — Op Note (Signed)
[facility] Patient Name: Christine Turner Procedure Date: 10/22/2019 10:40 AM MRN: [id number] Endoscopist: Napoleon Form , MD Age: 44 Referring MD:  Date of Birth: [birth year] Gender: Female Account #: [id number] Procedure:                Colonoscopy Indications:              Screening for colorectal malignant neoplasm Medicines:                Monitored Anesthesia Care Procedure:                Pre-Anesthesia Assessment:                           - Prior to the procedure, a History and Physical                            was performed, and patient medications and                            allergies were reviewed. The patient's tolerance of                            previous anesthesia was also reviewed. The risks                            and benefits of the procedure and the sedation                            options and risks were discussed with the patient.                            All questions were answered, and informed consent                            was obtained. Prior Anticoagulants: The patient has                            taken no previous anticoagulant or antiplatelet                            agents. ASA Grade Assessment: II - A patient with                            mild systemic disease. After reviewing the risks                            and benefits, the patient was deemed in                            satisfactory condition to undergo the procedure.                           After obtaining informed consent, the colonoscope  was passed under direct vision. Throughout the                            procedure, the patient's blood pressure, pulse, and                            oxygen saturations were monitored continuously. The                            Colonoscope was introduced through the anus and                            advanced to the the cecum, identified by                            appendiceal orifice  and ileocecal valve. The                            colonoscopy was performed without difficulty. The                            patient tolerated the procedure well. The quality                            of the bowel preparation was excellent. The                            ileocecal valve, appendiceal orifice, and rectum                            were photographed. Scope In: 10:53:19 AM Scope Out: 11:10:57 AM Scope Withdrawal Time: 0 hours 11 minutes 17 seconds  Total Procedure Duration: 0 hours 17 minutes 38 seconds  Findings:                 The perianal and digital rectal examinations were                            normal.                           Non-bleeding internal hemorrhoids were found during                            retroflexion. The hemorrhoids were small.                           The exam was otherwise without abnormality. Complications:            No immediate complications. Estimated Blood Loss:     Estimated blood loss was minimal. Impression:               - Non-bleeding internal hemorrhoids.                           - The examination was otherwise normal.                           -  No specimens collected. Recommendation:           - Patient has a contact number available for                            emergencies. The signs and symptoms of potential                            delayed complications were discussed with the                            patient. Return to normal activities tomorrow.                            Written discharge instructions were provided to the                            patient.                           - Resume previous diet.                           - Continue present medications.                           - Repeat colonoscopy in 10 years for screening                            purposes. Napoleon Form, MD 10/22/2019 11:16:47 AM This report has been signed electronically.

## 2019-10-26 ENCOUNTER — Telehealth: Payer: Self-pay | Admitting: *Deleted

## 2019-10-26 NOTE — Telephone Encounter (Signed)
  Follow up Call-  Call back number 10/22/2019  Post procedure Call Back phone  # [phone]  Permission to leave phone message Yes  Some recent data might be hidden     Patient questions:  Do you have a fever, pain , or abdominal swelling? No. Pain Score  0 *  Have you tolerated food without any problems? Yes.    Have you been able to return to your normal activities? Yes.    Do you have any questions about your discharge instructions: Diet   No. Medications  No. Follow up visit  No.  Do you have questions or concerns about your Care? No.  Actions: * If pain score is 4 or above: No action needed, pain <4.  1. Have you developed a fever since your procedure? no  2.   Have you had an respiratory symptoms (SOB or cough) since your procedure? no  3.   Have you tested positive for COVID 19 since your procedure no  4.   Have you had any family members/close contacts diagnosed with the COVID 19 since your procedure?  no   If yes to any of these questions please route to Laverna Peace, RN and Karlton Lemon, RN

## 2019-11-10 ENCOUNTER — Ambulatory Visit (INDEPENDENT_AMBULATORY_CARE_PROVIDER_SITE_OTHER): Payer: BC Managed Care – PPO | Admitting: Internal Medicine

## 2019-11-10 ENCOUNTER — Encounter: Payer: Self-pay | Admitting: Internal Medicine

## 2019-11-10 ENCOUNTER — Other Ambulatory Visit: Payer: Self-pay

## 2019-11-10 DIAGNOSIS — F5104 Psychophysiologic insomnia: Secondary | ICD-10-CM | POA: Diagnosis not present

## 2019-11-10 DIAGNOSIS — F329 Major depressive disorder, single episode, unspecified: Secondary | ICD-10-CM

## 2019-11-10 DIAGNOSIS — F419 Anxiety disorder, unspecified: Secondary | ICD-10-CM

## 2019-11-10 MED ORDER — HYDROXYZINE HCL 25 MG PO TABS: 25.0000 mg | ORAL_TABLET | Freq: Two times a day (BID) | ORAL | 1 refills | Status: DC | PRN

## 2019-11-10 NOTE — Patient Instructions (Signed)
Persistent Depressive Disorder  Persistent depressive disorder (PDD) is a mental health condition. PDD causes symptoms of low-level depression for 2 years or longer. It may also be called long-term (chronic) depression or dysthymia. PDD may include episodes of more severe depression that last for about 2 weeks (major depressive disorder or MDD). PDD can affect the way you think, feel, and sleep. This condition may also affect your relationships. You may be more likely to get sick if you have PDD. Symptoms of PDD occur for most of the day and may include:  Feeling tired (fatigue).  Low energy.  Eating too much or too little.  Sleeping too much or too little.  Feeling restless or agitated.  Feeling hopeless.  Feeling worthless or guilty.  Feeling worried or nervous (anxiety).  Trouble concentrating or making decisions.  Low self-esteem.  A negative way of looking at things (outlook).  Not being able to have fun or feel pleasure.  Avoiding interacting with people.  Getting angry or annoyed easily (irritability).  Acting aggressive or angry. Follow these instructions at home: Activity  Go back to your normal activities as told by your doctor.  Exercise regularly as told by your doctor. General instructions  Take over-the-counter and prescription medicines only as told by your doctor.  Do not drink alcohol. Or, limit how much alcohol you drink to no more than 1 drink a day for nonpregnant women and 2 drinks a day for men. One drink equals 12 oz of beer, 5 oz of wine, or 1 oz of hard liquor. Alcohol can affect any antidepressant medicines you are taking. Talk with your doctor about your alcohol use.  Eat a healthy diet and get plenty of sleep.  Find activities that you enjoy each day.  Consider joining a support group. Your doctor may be able to suggest a support group.  Keep all follow-up visits as told by your doctor. This is important. Where to find more  information National Alliance on Mental Illness  www.nami.org [place] National Institute of Mental Health  www.nimh.nih.gov National Suicide Prevention Lifeline  (1-800-273-8255).  This is free, 24-hour help. Contact a doctor if:  Your symptoms get worse.  You have new symptoms.  You have trouble sleeping or doing your daily activities. Get help right away if:  You self-harm.  You have serious thoughts about hurting yourself or others.  You see, hear, taste, smell, or feel things that are not there (hallucinate). This information is not intended to replace advice given to you by your health care provider. Make sure you discuss any questions you have with your health care provider. Document Revised: 01/24/2017 Document Reviewed: 10/06/2015 Elsevier Patient Education  2020 Elsevier Inc.  

## 2019-11-10 NOTE — Assessment & Plan Note (Signed)
Increase Hydroxyzine to 25 mg BID prn- sedation caution given Will monitor

## 2019-11-10 NOTE — Progress Notes (Signed)
Subjective:    Patient ID: Christine Turner, female    DOB: 05-16-1975, 44 y.o.   MRN: [id number]  HPI  Pt presents to the clinic today for follow up of anxiety and depression. She feels like her anxiety and depression have been worse recently. She feels like this is all stress related from work. 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 She is actively looking for a new job. She would like STD for an additional 6 weeks to try to figure out a job transition. She is currently taking Hydroxyzine as needed. She did find a higher dose that she was prescribed during her pregnancy, which she has been taking and feels like the 25 mg is more effective than the 10 mg. She is not currently seeing a therapist, but has support from her husband. She would like leave from 11/22/19-01/03/20.  Review of Systems      Past Medical History:  Diagnosis Date  . Anemia   . Thalassemia trait     Current Outpatient Medications  Medication Sig Dispense Refill  . cetirizine (ZYRTEC) 10 MG tablet Take 10 mg by mouth daily.    . Coenzyme Q10 (COQ10) 200 MG CAPS Take 2 capsules by mouth.    . diphenhydrAMINE (BENADRYL) 25 mg capsule Take 25 mg by mouth every 6 (six) hours as needed.    Marland Kitchen EPINEPHrine 0.3 mg/0.3 mL IJ SOAJ injection Inject 0.3 mLs (0.3 mg total) into the muscle as needed for anaphylaxis. 2 each 0  . hydrOXYzine (ATARAX/VISTARIL) 10 MG tablet Take 1 tablet (10 mg total) by mouth every 8 (eight) hours as needed. 60 tablet 0  . Melatonin 5 MG CAPS Take by mouth.    . Prenatal Vit-Fe Fumarate-FA (PRENATAL MULTIVITAMIN) TABS tablet Take 1 tablet by mouth daily at 12 noon.    . traZODone (DESYREL) 50 MG tablet TAKE 1/2-1 TABLET BY MOUTH AT BEDTIME AS NEEDED FOR SLEEP. 90 tablet 0  . tretinoin (RETIN-A) 0.025 % cream Apply topically at bedtime. 45 g 0  . Vitamin D, Ergocalciferol, (DRISDOL) 1.25  MG (50000 UNIT) CAPS capsule Take 1 capsule (50,000 Units total) by mouth every 7 (seven) days. (Patient not taking: Reported on 09/21/2019) 12 capsule 0   No current facility-administered medications for this visit.    Allergies  Allergen Reactions  . Almond (Diagnostic) Anaphylaxis and Swelling  . Fish Allergy Anaphylaxis  . Penicillins Hives and Swelling    Has patient had a PCN reaction causing immediate rash, facial/tongue/throat swelling, SOB or lightheadedness with hypotension: Unknown Has patient had a PCN reaction causing severe rash involving mucus membranes or skin necrosis: No Has patient had a PCN reaction that required hospitalization No Has patient had a PCN reaction occurring within the last 10 years: No If all of the above answers are "NO", then may proceed with Cephalosporin use.   Marland Kitchen Carrot [Daucus Carota] Itching and Swelling    Swelling and itching is of the throat.    Family History  Problem Relation Age of Onset  . Hypertension Mother   . Diabetes Father   . Hypertension Father   . Colon cancer Neg Hx   . Colon polyps Neg Hx   . Esophageal cancer Neg Hx   . Rectal cancer Neg Hx   . Stomach cancer Neg Hx     Social History   Socioeconomic History  . Marital status:  Married    Spouse name: Not on file  . Number of children: Not on file  . Years of education: Not on file  . Highest education level: Not on file  Occupational History  . Not on file  Tobacco Use  . Smoking status: Never Smoker  . Smokeless tobacco: Never Used  Vaping Use  . Vaping Use: Never used  Substance and Sexual Activity  . Alcohol use: Yes    Alcohol/week: 2.0 standard drinks    Types: 2 Glasses of wine per week  . Drug use: Yes    Comment: canibis gummies daily per pt  . Sexual activity: Yes    Birth control/protection: I.U.D.  Other Topics Concern  . Not on file  Social History Narrative  . Not on file   Social Determinants of Health   Financial Resource Strain:     . Difficulty of Paying Living Expenses: Not on file  Food Insecurity:   . Worried About Programme researcher, broadcasting/film/video in the Last Year: Not on file  . Ran Out of Food in the Last Year: Not on file  Transportation Needs:   . Lack of Transportation (Medical): Not on file  . Lack of Transportation (Non-Medical): Not on file  Physical Activity:   . Days of Exercise per Week: Not on file  . Minutes of Exercise per Session: Not on file  Stress:   . Feeling of Stress : Not on file  Social Connections:   . Frequency of Communication with Friends and Family: Not on file  . Frequency of Social Gatherings with Friends and Family: Not on file  . Attends Religious Services: Not on file  . Active Member of Clubs or Organizations: Not on file  . Attends Banker Meetings: Not on file  . Marital Status: Not on file  Intimate Partner Violence:   . Fear of Current or Ex-Partner: Not on file  . Emotionally Abused: Not on file  . Physically Abused: Not on file  . Sexually Abused: Not on file     Constitutional: Denies fever, malaise, fatigue, headache or abrupt weight changes.  Respiratory: Denies difficulty breathing, shortness of breath, cough or sputum production.   Cardiovascular: Denies chest pain, chest tightness, palpitations or swelling in the hands or feet.  Neurological: Denies dizziness, difficulty with memory, difficulty with speech or problems with balance and coordination.  Psych:  Reports anxiety and depression. Pt denies SI/HI.  No other specific complaints in a complete review of systems (except as listed in HPI above).  Objective:   Physical Exam  BP 112/70   Pulse 82   Temp 98.6 F (37 C) (Temporal)   Wt 119 lb (54 kg)   SpO2 98%   BMI 20.43 kg/m   Wt Readings from Last 3 Encounters:  10/22/19 122 lb (55.3 kg)  10/08/19 122 lb (55.3 kg)  09/21/19 122 lb (55.3 kg)    General: Appears her stated age, well developed, well nourished in NAD. Cardiovascular: Normal  rate. Pulmonary/Chest: Normal effort.  Neurological: Alert and oriented.  Psychiatric: Tearful. Behavior is normal. Judgment and thought content normal.    BMET    Component Value Date/Time   NA 137 08/10/2019 1223   NA 140 04/08/2018 1018   K 3.7 08/10/2019 1223   CL 107 08/10/2019 1223   CO2 26 08/10/2019 1223   GLUCOSE 80 08/10/2019 1223   BUN 11 08/10/2019 1223   BUN 8 04/08/2018 1018   CREATININE 1.04 08/10/2019 1223  CALCIUM 9.0 08/10/2019 1223   GFRNONAA 83 04/08/2018 1018   GFRAA 96 04/08/2018 1018    Lipid Panel     Component Value Date/Time   CHOL 69 08/10/2019 1223   CHOL 84 (L) 04/08/2018 1018   TRIG 57.0 08/10/2019 1223   HDL 27.40 (L) 08/10/2019 1223   HDL 29 (L) 04/08/2018 1018   CHOLHDL 3 08/10/2019 1223   VLDL 11.4 08/10/2019 1223   LDLCALC 30 08/10/2019 1223   LDLCALC 42 04/08/2018 1018    CBC    Component Value Date/Time   WBC 6.9 08/10/2019 1223   RBC 4.29 08/10/2019 1223   HGB 9.8 (L) 08/10/2019 1223   HGB 9.7 (L) 03/29/2019 1649   HCT 29.8 (L) 08/10/2019 1223   HCT 30.4 (L) 03/29/2019 1649   PLT 172.0 08/10/2019 1223   PLT 146 (L) 03/29/2019 1649   MCV 69.4 Repeated and verified X2. (L) 08/10/2019 1223   MCV 71 (L) 03/29/2019 1649   MCH 22.5 (L) 03/29/2019 1649   MCH 25.0 (L) 11/07/2015 1525   MCHC 32.8 08/10/2019 1223   RDW 21.8 (H) 08/10/2019 1223   RDW 22.6 (H) 03/29/2019 1649    Hgb A1C Lab Results  Component Value Date   HGBA1C <4.2 (L) 04/08/2018            Assessment & Plan:     Nicki Reaper, NP This visit occurred during the SARS-CoV-2 public health emergency.  Safety protocols were in place, including screening questions prior to the visit, additional usage of staff PPE, and extensive cleaning of exam room while observing appropriate contact time as indicated for disinfecting solutions.

## 2019-11-10 NOTE — Assessment & Plan Note (Signed)
Persistent and deteriorated Support offered today Will increase Hydroxyzine to 25 mg BID prn- sedation caution give I do not think she will benefit from SSRI therapy at this time as this is situational and she is actively looking to transition her employment STD will be filled out, faxed and she will be notified when the originals are available for pickup

## 2019-11-11 ENCOUNTER — Telehealth: Payer: Self-pay | Admitting: Internal Medicine

## 2019-11-11 NOTE — Telephone Encounter (Signed)
Done, given back to Robin 

## 2019-11-11 NOTE — Telephone Encounter (Signed)
fmla paperwork in [employer]  In box 

## 2019-11-12 NOTE — Telephone Encounter (Signed)
Paperwork faxed °

## 2019-11-18 NOTE — Telephone Encounter (Signed)
Copy for pt °Copy for scan °

## 2019-11-29 NOTE — Telephone Encounter (Addendum)
Pt states her company lost the FMLA paperwork Robin faxed to them.  She wants to know if we can re-fax it to the # on the [facility] documentation.  On the cover sheet case #[id number] needs to be included.  They need it today if possible.  Thank you!

## 2019-11-29 NOTE — Telephone Encounter (Signed)
This has been refaxed with the new case number. I called and let patient know it has been completed.

## 2019-12-08 ENCOUNTER — Telehealth: Payer: Self-pay | Admitting: Radiology

## 2019-12-08 NOTE — Telephone Encounter (Signed)
Called patient to schedule appointment for abdominal pain and bloating. No answer and mail box was full

## 2019-12-15 ENCOUNTER — Other Ambulatory Visit: Payer: Self-pay | Admitting: Internal Medicine

## 2020-01-25 ENCOUNTER — Ambulatory Visit: Payer: BC Managed Care – PPO | Admitting: Obstetrics & Gynecology

## 2020-02-26 ENCOUNTER — Ambulatory Visit (HOSPITAL_COMMUNITY)
Admission: EM | Admit: 2020-02-26 | Discharge: 2020-02-26 | Disposition: A | Discharge status: Home / Self Care | Payer: BC Managed Care – PPO | Attending: Internal Medicine | Admitting: Internal Medicine

## 2020-02-26 ENCOUNTER — Ambulatory Visit (HOSPITAL_COMMUNITY): Admit: 2020-02-26 | Discharge status: Absent without leave | Payer: BC Managed Care – PPO

## 2020-02-26 ENCOUNTER — Encounter (HOSPITAL_COMMUNITY): Payer: Self-pay | Admitting: Emergency Medicine

## 2020-02-26 DIAGNOSIS — Z20822 Contact with and (suspected) exposure to covid-19: Secondary | ICD-10-CM | POA: Diagnosis present

## 2020-02-26 DIAGNOSIS — R6889 Other general symptoms and signs: Secondary | ICD-10-CM

## 2020-02-26 LAB — RESP PANEL BY RT-PCR (FLU A&B, COVID) ARPGX2
Influenza A by PCR: NEGATIVE
Influenza B by PCR: NEGATIVE
SARS Coronavirus 2 by RT PCR: POSITIVE — AB

## 2020-02-26 LAB — POCT RAPID STREP A, ED / UC: Streptococcus, Group A Screen (Direct): NEGATIVE

## 2020-02-26 MED ORDER — OSELTAMIVIR PHOSPHATE 75 MG PO CAPS: 75.0000 mg | ORAL_CAPSULE | Freq: Two times a day (BID) | ORAL | 0 refills | Status: DC

## 2020-02-26 MED ORDER — BENZONATATE 200 MG PO CAPS: 200.0000 mg | ORAL_CAPSULE | Freq: Two times a day (BID) | ORAL | 0 refills | Status: DC | PRN

## 2020-02-26 NOTE — Discharge Instructions (Addendum)
You may take the following supplements to help your immune system be stronger to fight this viral infection Take Quarcetin 500 mg three times a day x 7 days with Zinc 50 mg ones a day x 7 days. The quarcetin is an antiviral and anti-inflammatory supplement which helps open the zinc channels in the cell to absorb Zinc. Zinc helps decrease the virus load in your body. Take Melatonin 6-10 mg at bed time which also helps support your immune system.  Also make sure to take Vit D 5,000 IU per day with a fatty meal and Vit C 5000 mg a day until you are completely better. To prevent viral illnesses your vitamin D should be between 60-80. Stay on Vitamin D 2,000  and C  1000 mg the rest of the season.  Don't lay around, keep active and walk as much as you are able to to prevent worsening of your symptoms.  Follow up with your family Dr next week.  If you get short of breath and you are able to check  your oxygen with a pulse oxygen meter, if it gets to 92% or less, you need to go to the hospital to be admitted. If you dont have one, come back here and we will assess you.   

## 2020-02-26 NOTE — ED Triage Notes (Signed)
PT C/O: cold sx onset 2 days associated w/cough, fever, body aches   TAKING MEDS: OTC cold meds  A&O x4... NAD... Ambulatory

## 2020-02-26 NOTE — ED Provider Notes (Signed)
[place]    CSN: [id number] Arrival date & time: 02/26/20  1759      History   Chief Complaint Chief Complaint  Patient presents with  . URI    HPI Christine Turner is a 45 y.o. female who presents with onse t of cough, fever, of 102. Had sweats yesterday. Has been fatigued and having HA.  Body aches and rhinitis x 2 days.  Was exposed to someone with covid.     Past Medical History:  Diagnosis Date  . Anemia   . Thalassemia trait     Patient Active Problem List   Diagnosis Date Noted  . Insomnia 09/21/2019  . Anxiety and depression 08/10/2019  . IDA (iron deficiency anemia) 12/12/2017  . Beta thalassemia ([facility]) 07/25/2015    Past Surgical History:  Procedure Laterality Date  . BREAST BIOPSY    . CESAREAN SECTION    . COLONOSCOPY  at age 22   normal exam per pt  . DILATION AND CURETTAGE OF UTERUS      OB History    Gravida  5   Para  2   Term  2   Preterm      AB  3   Living  3     SAB  2   IAB  1   Ectopic      Multiple  1   Live Births  3            Home Medications    Prior to Admission medications   Medication Sig Start Date End Date Taking? Authorizing Provider  benzonatate (TESSALON) 200 MG capsule Take 1 capsule (200 mg total) by mouth 2 (two) times daily as needed for cough. 02/26/20  Yes Rodriguez-Southworth, Sunday Spillers, PA-C  oseltamivir (TAMIFLU) 75 MG capsule Take 1 capsule (75 mg total) by mouth every 12 (twelve) hours. 02/26/20  Yes Rodriguez-Southworth, Sunday Spillers, PA-C  cetirizine (ZYRTEC) 10 MG tablet Take 10 mg by mouth daily.    [provider]  Coenzyme Q10 (COQ10) 200 MG CAPS Take 2 capsules by mouth.    [provider]  diphenhydrAMINE (BENADRYL) 25 mg capsule Take 25 mg by mouth every 6 (six) hours as needed.    [provider]  EPINEPHrine 0.3 mg/0.3 mL IJ SOAJ injection Inject 0.3 mLs (0.3 mg total) into the muscle as needed for anaphylaxis. 09/21/19   Jearld Fenton, NP  hydrOXYzine  (ATARAX/VISTARIL) 25 MG tablet TAKE 1 TABLET TWICE A DAY AS NEEDED 12/17/19   Jearld Fenton, NP  Melatonin 5 MG CAPS Take by mouth.    [provider]  Prenatal Vit-Fe Fumarate-FA (PRENATAL MULTIVITAMIN) TABS tablet Take 1 tablet by mouth daily at 12 noon.    [provider]  traZODone (DESYREL) 50 MG tablet TAKE 1/2-1 TABLET BY MOUTH AT BEDTIME AS NEEDED FOR SLEEP. 10/14/19   Jearld Fenton, NP  tretinoin (RETIN-A) 0.025 % cream Apply topically at bedtime. 08/10/19   Jearld Fenton, NP  Vitamin D, Ergocalciferol, (DRISDOL) 1.25 MG (50000 UNIT) CAPS capsule Take 1 capsule (50,000 Units total) by mouth every 7 (seven) days. Patient not taking: Reported on 09/21/2019 08/13/19   Jearld Fenton, NP    Family History Family History  Problem Relation Age of Onset  . Hypertension Mother   . Diabetes Father   . Hypertension Father   . Colon cancer Neg Hx   . Colon polyps Neg Hx   . Esophageal cancer Neg Hx   .  Rectal cancer Neg Hx   . Stomach cancer Neg Hx     Social History Social History   Tobacco Use  . Smoking status: Never Smoker  . Smokeless tobacco: Never Used  Vaping Use  . Vaping Use: Never used  Substance Use Topics  . Alcohol use: Yes    Alcohol/week: 2.0 standard drinks    Types: 2 Glasses of wine per week  . Drug use: Yes    Comment: canibis gummies daily per pt     Allergies   Almond (diagnostic), Fish allergy, Penicillins, and Carrot [daucus carota]   Review of Systems Review of Systems  Constitutional: Positive for appetite change, chills, diaphoresis, fatigue and fever.  HENT: Positive for postnasal drip.   Eyes: Negative for discharge.  Respiratory: Positive for cough. Negative for shortness of breath and wheezing.   Gastrointestinal: Negative for abdominal pain, diarrhea, nausea and vomiting.  Musculoskeletal: Positive for myalgias.  Skin: Negative for rash.  Neurological: Positive for headaches.  Hematological: Negative for  adenopathy.     Physical Exam Triage Vital Signs ED Triage Vitals  Enc Vitals Group     BP 02/26/20 1924 102/69     Pulse Rate 02/26/20 1924 83     Resp 02/26/20 1924 14     Temp 02/26/20 1924 98.3 F (36.8 C)     Temp Source 02/26/20 1924 Oral     SpO2 02/26/20 1924 100 %     Weight --      Height --      Head Circumference --      Peak Flow --      Pain Score 02/26/20 1922 2     Pain Loc --      Pain Edu? --      Excl. in GC? --    No data found.  Updated Vital Signs BP 102/69 (BP Location: Right Arm)   Pulse 83   Temp 98.3 F (36.8 C) (Oral)   Resp 14   LMP 02/23/2020   SpO2 100%   Breastfeeding No   Visual Acuity Right Eye Distance:   Left Eye Distance:   Bilateral Distance:    Right Eye Near:   Left Eye Near:    Bilateral Near:     Physical Exam Physical Exam Vitals signs and nursing note reviewed.  Constitutional:      General: She is not in acute distress.    Appearance: Normal appearance. She is not ill-appearing, toxic-appearing or diaphoretic.  HENT:     Head: Normocephalic.     Right Ear: Tympanic membrane, ear canal and external ear normal.     Left Ear: Tympanic membrane, ear canal and external ear normal.     Nose: Nose normal.     Mouth/Throat:     Mouth: Mucous membranes are moist.  Eyes:     General: No scleral icterus.       Right eye: No discharge.        Left eye: No discharge.     Conjunctiva/sclera: Conjunctivae normal.  Neck:     Musculoskeletal: Neck supple. No neck rigidity.  Cardiovascular:     Rate and Rhythm: Normal rate and regular rhythm.     Heart sounds: No murmur.  Pulmonary:     Effort: Pulmonary effort is normal.     Breath sounds: Normal breath sounds.   Musculoskeletal: Normal range of motion.  Lymphadenopathy:     Cervical: No cervical adenopathy.  Skin:    General: Skin is warm  and dry.     Coloration: Skin is not jaundiced.     Findings: No rash.  Neurological:     Mental Status: She is alert and  oriented to person, place, and time.     Gait: Gait normal.  Psychiatric:        Mood and Affect: Mood normal.        Behavior: Behavior normal.        Thought Content: Thought content normal.        Judgment: Judgment normal.     UC Treatments / Results  Labs (all labs ordered are listed, but only abnormal results are displayed) Labs Reviewed  RESP PANEL BY RT-PCR (FLU A&B, COVID) ARPGX2  CULTURE, GROUP A STREP [facility])  POCT RAPID STREP A, ED / UC  Rapid strep neg.   EKG   Radiology No results found.  Procedures Procedures (including critical care time)  Medications Ordered in UC Medications - No data to display  Initial Impression / Assessment and Plan / UC Course  I have reviewed the triage vital signs and the nursing notes. Covid and Flu are pending for 2-3 days. In the mean time I placed her on Tamiflu  And tessalon as noted. See instructions.  Pertinent labs & imaging results that were available during my care of the patient were reviewed by me and considered in my medical decision making (see chart for details).  Final Clinical Impressions(s) / UC Diagnoses   Final diagnoses:  Flu-like symptoms  Close exposure to COVID-19 virus     Discharge Instructions     You may take the following supplements to help your immune system be stronger to fight this viral infection Take Quarcetin 500 mg three times a day x 7 days with Zinc 50 mg ones a day x 7 days. The quarcetin is an antiviral and anti-inflammatory supplement which helps open the zinc channels in the cell to absorb Zinc. Zinc helps decrease the virus load in your body. Take Melatonin 6-10 mg at bed time which also helps support your immune system.  Also make sure to take Vit D 5,000 IU per day with a fatty meal and Vit C 5000 mg a day until you are completely better. To prevent viral illnesses your vitamin D should be between 60-80. Stay on Vitamin D 2,000  and C  1000 mg the rest of the season.  Don't lay  around, keep active and walk as much as you are able to to prevent worsening of your symptoms.  Follow up with your family Dr next week.  If you get short of breath and you are able to check  your oxygen with a pulse oxygen meter, if it gets to 92% or less, you need to go to the hospital to be admitted. If you dont have one, come back here and we will assess you.      ED Prescriptions    Medication Sig Dispense Auth. Provider   oseltamivir (TAMIFLU) 75 MG capsule Take 1 capsule (75 mg total) by mouth every 12 (twelve) hours. 10 capsule Rodriguez-Southworth, Nettie Elm, PA-C   benzonatate (TESSALON) 200 MG capsule Take 1 capsule (200 mg total) by mouth 2 (two) times daily as needed for cough. 30 capsule Rodriguez-Southworth, Nettie Elm, PA-C     PDMP not reviewed this encounter.   Garey Ham, Cordelia Poche 02/26/20 2024

## 2020-02-29 LAB — CULTURE, GROUP A STREP (THRC)

## 2020-03-03 ENCOUNTER — Telehealth: Payer: Self-pay

## 2020-03-03 NOTE — Telephone Encounter (Signed)
Patient states that she was seen at Urgent care on 02/26/2020. Received positive covid. Patient has called office needs to get note for work. Has not received response from their office. In order to get paid for missed work she needs to have letter with the following information. Wanted to know if she can get from our office. Let patent know that you are not in the office. May not get response until Monday.    Date of pos test: 02/26/2020.  Type of test: PCR  Dates that she will need to quarantine 02/26/2020 - 03/07/2020

## 2020-03-04 NOTE — Telephone Encounter (Signed)
Have her schedule VV follow up, note will be provided then

## 2020-03-04 NOTE — Telephone Encounter (Signed)
Called patient put in for virtual with Dr. Milinda Antis on Monday.

## 2020-03-06 ENCOUNTER — Other Ambulatory Visit: Payer: Self-pay

## 2020-03-06 ENCOUNTER — Telehealth (INDEPENDENT_AMBULATORY_CARE_PROVIDER_SITE_OTHER): Payer: BC Managed Care – PPO | Admitting: Family Medicine

## 2020-03-06 ENCOUNTER — Encounter: Payer: Self-pay | Admitting: Family Medicine

## 2020-03-06 DIAGNOSIS — U071 COVID-19: Secondary | ICD-10-CM | POA: Diagnosis not present

## 2020-03-06 NOTE — Patient Instructions (Signed)
Drink lots of fluids and rest when you can  Please alert [place] if any symptoms worsen, re occur or fail to improve   You should be able to pull the work note up from [place]  If any problems please call

## 2020-03-06 NOTE — Progress Notes (Signed)
Virtual Visit via Video Note  I connected with Christine Turner on 03/06/20 at 12:00 PM EST by a video enabled telemedicine application and verified that I am speaking with the correct person using two identifiers.  Location: Patient: home Provider: office   I discussed the limitations of evaluation and management by telemedicine and the availability of in person appointments. The patient expressed understanding and agreed to proceed.  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 COVID-19 03/06/2020  . Insomnia 09/21/2019  . Anxiety and depression 08/10/2019  . IDA (iron deficiency anemia) 12/12/2017  . Beta thalassemia ([facility]) 07/25/2015   Past Medical History:  Diagnosis Date  . Anemia   . Thalassemia trait    Past Surgical History:  Procedure Laterality Date  . BREAST BIOPSY    . CESAREAN SECTION    . COLONOSCOPY  at age 25   normal exam per pt  . DILATION AND CURETTAGE OF UTERUS     Social History   Tobacco Use  . Smoking status: Never Smoker  . Smokeless tobacco: Never Used  Vaping Use  . Vaping Use: Never used  Substance Use Topics  . Alcohol use: Yes    Alcohol/week: 2.0 standard drinks    Types: 2 Glasses of wine per week  . Drug use: Yes    Comment: canibis gummies daily per pt    Family History  Problem Relation Age of Onset  . Hypertension Mother   . Diabetes Father   . Hypertension Father   . Colon cancer Neg Hx   . Colon polyps Neg Hx   . Esophageal cancer Neg Hx   . Rectal cancer Neg Hx   . Stomach cancer Neg Hx    Allergies  Allergen Reactions  . Almond (Diagnostic) Anaphylaxis and Swelling  . Fish Allergy Anaphylaxis  . Penicillins Hives and Swelling    Has patient had a PCN reaction causing immediate rash, facial/tongue/throat swelling, SOB or lightheadedness with hypotension: Unknown Has patient had a PCN reaction causing severe rash involving mucus membranes or skin necrosis: No Has patient had a PCN reaction that required hospitalization No Has patient had a PCN reaction occurring within the last 10 years: No If all of the above answers are "NO", then may proceed with Cephalosporin use.   Marland Kitchen Carrot [Daucus Carota] Itching and Swelling    Swelling and itching is of the throat.   Current Outpatient Medications on File Prior to Visit  Medication Sig Dispense Refill  . benzonatate (TESSALON) 200 MG capsule Take 1 capsule (200 mg total) by mouth 2 (two) times daily as needed for cough. 30 capsule  0  . cetirizine (ZYRTEC) 10 MG tablet Take 10 mg by mouth daily.    . Coenzyme Q10 (COQ10) 200 MG CAPS Take 2 capsules by mouth.    . diphenhydrAMINE (BENADRYL) 25 mg capsule Take 25 mg by mouth every 6 (six) hours as needed.    Marland Kitchen EPINEPHrine 0.3 mg/0.3 mL IJ SOAJ injection Inject 0.3 mLs (0.3 mg total) into the muscle as needed for anaphylaxis. 2 each 0  . hydrOXYzine (ATARAX/VISTARIL) 25 MG tablet TAKE 1 TABLET TWICE A DAY AS NEEDED 180 tablet 1  . Melatonin 5 MG CAPS Take by mouth.    . oseltamivir (TAMIFLU) 75 MG capsule Take 1 capsule (75 mg total) by mouth every 12 (twelve) hours. 10 capsule 0  . Prenatal Vit-Fe Fumarate-FA (PRENATAL MULTIVITAMIN) TABS tablet Take 1 tablet by mouth daily at 12 noon.    . traZODone (DESYREL) 50 MG tablet TAKE 1/2-1  TABLET BY MOUTH AT BEDTIME AS NEEDED FOR SLEEP. 90 tablet 0  . tretinoin (RETIN-A) 0.025 % cream Apply topically at bedtime. 45 g 0   No current facility-administered medications on file prior to visit.   Review of Systems  Constitutional: Positive for malaise/fatigue. Negative for chills and fever.  HENT: Negative for congestion, ear pain, sinus pain and sore throat.   Eyes: Negative for blurred vision, discharge and redness.  Respiratory: Positive for cough. Negative for sputum production, shortness of breath, wheezing and stridor.   Cardiovascular: Negative for chest pain, palpitations and leg swelling.  Gastrointestinal: Negative for abdominal pain, diarrhea, nausea and vomiting.  Musculoskeletal: Negative for myalgias.  Skin: Negative for rash.  Neurological: Negative for dizziness and headaches.    Observations/Objective: Patient appears well, in no distress Weight is baseline  No facial swelling or asymmetry Normal voice-not hoarse and no slurred speech occ clears her throat while talking  No obvious tremor or mobility impairment Moving neck and UEs normally Able to hear the call well  No cough or shortness of breath during interview  Talkative and mentally sharp with no cognitive changes No skin changes on face or neck , no rash or pallor Affect is normal    Assessment and Plan: Problem List Items Addressed This Visit      Other   COVID-19    Pos test on 02/26/20 in immunized pt  Still fatigued with mild cough but much better Will try to return to work on 03/09/20 if she continues to improve  inst to update if new symptoms or not improving Disc use of masks  Enc rest when able and lots of fluids           Follow Up Instructions: Drink lots of fluids and rest when you can  Please alert [place] if any symptoms worsen, re occur or fail to improve   You should be able to pull the work note up from [place]  If any problems please call    I discussed the assessment and  treatment plan with the patient. The patient was provided an opportunity to ask questions and all were answered. The patient agreed with the plan and demonstrated an understanding of the instructions.   The patient was advised to call back or seek an in-person evaluation if the symptoms worsen or if the condition fails to improve as anticipated.     Roxy Manns, MD

## 2020-03-06 NOTE — Assessment & Plan Note (Signed)
Pos test on 02/26/20 in immunized pt  Still fatigued with mild cough but much better Will try to return to work on 03/09/20 if she continues to improve  inst to update if new symptoms or not improving Disc use of masks  Enc rest when able and lots of fluids

## 2020-03-31 ENCOUNTER — Other Ambulatory Visit: Payer: Self-pay | Admitting: Internal Medicine

## 2020-03-31 DIAGNOSIS — Z1231 Encounter for screening mammogram for malignant neoplasm of breast: Secondary | ICD-10-CM

## 2020-04-20 ENCOUNTER — Ambulatory Visit (INDEPENDENT_AMBULATORY_CARE_PROVIDER_SITE_OTHER): Payer: BC Managed Care – PPO | Admitting: Student

## 2020-04-20 ENCOUNTER — Other Ambulatory Visit (HOSPITAL_COMMUNITY)
Admission: RE | Admit: 2020-04-20 | Discharge: 2020-04-20 | Disposition: A | Discharge status: Home / Self Care | Payer: BC Managed Care – PPO | Source: Ambulatory Visit | Attending: Student | Admitting: Student

## 2020-04-20 ENCOUNTER — Encounter: Payer: Self-pay | Admitting: Student

## 2020-04-20 ENCOUNTER — Other Ambulatory Visit: Payer: Self-pay

## 2020-04-20 VITALS — BP 108/42 | HR 85 | Ht 64.0 in | Wt 129.0 lb

## 2020-04-20 DIAGNOSIS — Z113 Encounter for screening for infections with a predominantly sexual mode of transmission: Secondary | ICD-10-CM | POA: Insufficient documentation

## 2020-04-20 DIAGNOSIS — Z01419 Encounter for gynecological examination (general) (routine) without abnormal findings: Secondary | ICD-10-CM | POA: Insufficient documentation

## 2020-04-20 NOTE — Progress Notes (Signed)
  History:  Ms. Christine Turner is a 45 y.o. [phone] who presents to clinic today for annual exam. She denies any symptoms. She has a paraguard and does not   The following portions of the patient's history were reviewed and updated as appropriate: allergies, current medications, family history, past medical history, social history, past surgical history and problem list.  Review of Systems:  Review of Systems  Constitutional: Negative.   HENT: Negative.   Eyes: Negative.   Respiratory: Negative.   Cardiovascular: Negative.   Genitourinary: Negative.   Musculoskeletal: Negative.   Skin: Negative.   Neurological: Negative.   Psychiatric/Behavioral: Negative.       Objective:  Physical Exam BP (!) 108/42   Pulse 85   Ht 5\' 4"  (1.626 m)   Wt 129 lb (58.5 kg)   LMP 04/18/2020   BMI 22.14 kg/m  Physical Exam Constitutional:      Appearance: Normal appearance.  HENT:     Right Ear: Tympanic membrane normal.  Cardiovascular:     Rate and Rhythm: Normal rate and regular rhythm.     Pulses: Normal pulses.  Pulmonary:     Effort: Pulmonary effort is normal.     Breath sounds: Normal breath sounds.  Abdominal:     General: Abdomen is flat.  Genitourinary:    General: Normal vulva.  Musculoskeletal:        General: Normal range of motion.  Skin:    General: Skin is warm and dry.  Neurological:     General: No focal deficit present.     Mental Status: She is alert.       Labs and Imaging No results found for this or any previous visit (from the past 24 hour(s)).  No results found.   Assessment & Plan:  1. Well woman exam with routine gynecological exam -Patient doing well, mammo is already scheduled. Will draw GT CT off of pap - Cytology - PAP( [place]) - CBC - RPR - HIV antibody (with reflex) - Hepatitis B Surface AntiGEN - Hepatitis C Antibody -March 24 is date of mammo   Approximately 35 minutes of total time was spent with this patient on  counseling  March 26, Marylene Land 04/20/2020 11:53 AM

## 2020-04-21 LAB — CBC
Hematocrit: 31.6 % — ABNORMAL LOW (ref 34.0–46.6)
Hemoglobin: 10 g/dL — ABNORMAL LOW (ref 11.1–15.9)
MCH: 22.3 pg — ABNORMAL LOW (ref 26.6–33.0)
MCHC: 31.6 g/dL (ref 31.5–35.7)
MCV: 70 fL — ABNORMAL LOW (ref 79–97)
NRBC: 4 % — ABNORMAL HIGH (ref 0–0)
Platelets: 204 10*3/uL (ref 150–450)
RBC: 4.49 x10E6/uL (ref 3.77–5.28)
RDW: 22.1 % — ABNORMAL HIGH (ref 11.7–15.4)
WBC: 8.8 10*3/uL (ref 3.4–10.8)

## 2020-04-21 LAB — HIV ANTIBODY (ROUTINE TESTING W REFLEX): HIV Screen 4th Generation wRfx: NONREACTIVE

## 2020-04-21 LAB — HEPATITIS B SURFACE ANTIGEN: Hepatitis B Surface Ag: NEGATIVE

## 2020-04-21 LAB — HEPATITIS C ANTIBODY: Hep C Virus Ab: <0.1 s/co ratio (ref 0.0–0.9)

## 2020-04-21 LAB — RPR: RPR Ser Ql: NONREACTIVE

## 2020-04-24 LAB — CYTOLOGY - PAP
Chlamydia: NEGATIVE
Comment: NEGATIVE
Comment: NEGATIVE
Comment: NEGATIVE
Comment: NORMAL
Diagnosis: NEGATIVE
High risk HPV: NEGATIVE
Neisseria Gonorrhea: NEGATIVE
Trichomonas: NEGATIVE

## 2020-05-12 ENCOUNTER — Telehealth: Payer: Self-pay

## 2020-05-12 NOTE — Telephone Encounter (Signed)
appt changed to 2pm appropriate for appt, will need to reschedule if 2pm will not work  Left detailed msg on VM per HIPAA

## 2020-05-16 ENCOUNTER — Ambulatory Visit: Payer: BC Managed Care – PPO | Admitting: Internal Medicine

## 2020-05-16 DIAGNOSIS — Z0289 Encounter for other administrative examinations: Secondary | ICD-10-CM

## 2020-05-16 NOTE — Progress Notes (Deleted)
Subjective:    Patient ID: Christine Turner, female    DOB: [birth year], 45 y.o.   MRN: [id number]  HPI  Patient presents the clinic today for follow-up of anxiety and depression.  This is currently managed on hydroxyzine 25 mg twice daily as needed.  She is not currently seeing a therapist.  She denies SI/HI.  Review of Systems  Past Medical History:  Diagnosis Date  . Anemia   . Thalassemia trait     Current Outpatient Medications  Medication Sig Dispense Refill  . cetirizine (ZYRTEC) 10 MG tablet Take 10 mg by mouth daily.    . Coenzyme Q10 (COQ10) 200 MG CAPS Take 2 capsules by mouth.    . diphenhydrAMINE (BENADRYL) 25 mg capsule Take 25 mg by mouth every 6 (six) hours as needed.    Marland Kitchen EPINEPHrine 0.3 mg/0.3 mL IJ SOAJ injection Inject 0.3 mLs (0.3 mg total) into the muscle as needed for anaphylaxis. 2 each 0  . hydrOXYzine (ATARAX/VISTARIL) 25 MG tablet TAKE 1 TABLET TWICE A DAY AS NEEDED 180 tablet 1  . Melatonin 5 MG CAPS Take by mouth.    . Prenatal Vit-Fe Fumarate-FA (PRENATAL MULTIVITAMIN) TABS tablet Take 1 tablet by mouth daily at 12 noon.    . tretinoin (RETIN-A) 0.025 % cream Apply topically at bedtime. 45 g 0   No current facility-administered medications for this visit.    Allergies  Allergen Reactions  . Almond (Diagnostic) Anaphylaxis and Swelling  . Fish Allergy Anaphylaxis  . Penicillins Hives and Swelling    Has patient had a PCN reaction causing immediate rash, facial/tongue/throat swelling, SOB or lightheadedness with hypotension: Unknown Has patient had a PCN reaction causing severe rash involving mucus membranes or skin necrosis: No Has patient had a PCN reaction that required hospitalization No Has patient had a PCN reaction occurring within the last 10 years: No If all of the above answers are "NO", then may proceed with Cephalosporin use.   Marland Kitchen Carrot [Daucus Carota] Itching and Swelling    Swelling and itching is of the throat.    Family History   Problem Relation Age of Onset  . Hypertension Mother   . Diabetes Father   . Hypertension Father   . Colon cancer Neg Hx   . Colon polyps Neg Hx   . Esophageal cancer Neg Hx   . Rectal cancer Neg Hx   . Stomach cancer Neg Hx     Social History   Socioeconomic History  . Marital status: Married    Spouse name: Not on file  . Number of children: Not on file  . Years of education: Not on file  . Highest education level: Not on file  Occupational History  . Not on file  Tobacco Use  . Smoking status: Never Smoker  . Smokeless tobacco: Never Used  Vaping Use  . Vaping Use: Never used  Substance and Sexual Activity  . Alcohol use: Yes    Alcohol/week: 2.0 standard drinks    Types: 2 Glasses of wine per week  . Drug use: Yes    Comment: canibis gummies daily per pt  . Sexual activity: Yes    Birth control/protection: I.U.D.  Other Topics Concern  . Not on file  Social History Narrative  . Not on file   Social Determinants of Health   Financial Resource Strain: Not on file  Food Insecurity: No Food Insecurity  . Worried About Programme researcher, broadcasting/film/video in the Last Year: Never true  .  Ran Out of Food in the Last Year: Never true  Transportation Needs: No Transportation Needs  . Lack of Transportation (Medical): No  . Lack of Transportation (Non-Medical): No  Physical Activity: Not on file  Stress: Not on file  Social Connections: Not on file  Intimate Partner Violence: Not on file     Constitutional: Denies fever, malaise, fatigue, headache or abrupt weight changes.  HEENT: Denies eye pain, eye redness, ear pain, ringing in the ears, wax buildup, runny nose, nasal congestion, bloody nose, or sore throat. Respiratory: Denies difficulty breathing, shortness of breath, cough or sputum production.   Cardiovascular: Denies chest pain, chest tightness, palpitations or swelling in the hands or feet.  Gastrointestinal: Denies abdominal pain, bloating, constipation, diarrhea or  blood in the stool.  GU: Denies urgency, frequency, pain with urination, burning sensation, blood in urine, odor or discharge. Musculoskeletal: Denies decrease in range of motion, difficulty with gait, muscle pain or joint pain and swelling.  Skin: Denies redness, rashes, lesions or ulcercations.  Neurological: Denies dizziness, difficulty with memory, difficulty with speech or problems with balance and coordination.  Psych: Patient reports anxiety and depression.  Denies SI/HI.  No other specific complaints in a complete review of systems (except as listed in HPI above).     Objective:   Physical Exam  LMP 04/18/2020  Wt Readings from Last 3 Encounters:  04/20/20 129 lb (58.5 kg)  03/06/20 120 lb (54.4 kg)  11/10/19 119 lb (54 kg)    General: Appears their stated age, well developed, well nourished in NAD. Skin: Warm, dry and intact. No rashes, lesions or ulcerations noted. HEENT: Head: normal shape and size; Eyes: sclera white, no icterus, conjunctiva pink, PERRLA and EOMs intact; Ears: Tm's gray and intact, normal light reflex; Nose: mucosa pink and moist, septum midline; Throat/Mouth: Teeth present, mucosa pink and moist, no exudate, lesions or ulcerations noted.  Neck:  Neck supple, trachea midline. No masses, lumps or thyromegaly present.  Cardiovascular: Normal rate and rhythm. S1,S2 noted.  No murmur, rubs or gallops noted. No JVD or BLE edema. No carotid bruits noted. Pulmonary/Chest: Normal effort and positive vesicular breath sounds. No respiratory distress. No wheezes, rales or ronchi noted.  Abdomen: Soft and nontender. Normal bowel sounds. No distention or masses noted. Liver, spleen and kidneys non palpable. Musculoskeletal: Normal range of motion. No signs of joint swelling. No difficulty with gait.  Neurological: Alert and oriented. Cranial nerves II-XII grossly intact. Coordination normal.  Psychiatric: Mood and affect normal. Behavior is normal. Judgment and thought  content normal.   EKG:  BMET    Component Value Date/Time   NA 137 08/10/2019 1223   NA 140 04/08/2018 1018   K 3.7 08/10/2019 1223   CL 107 08/10/2019 1223   CO2 26 08/10/2019 1223   GLUCOSE 80 08/10/2019 1223   BUN 11 08/10/2019 1223   BUN 8 04/08/2018 1018   CREATININE 1.04 08/10/2019 1223   CALCIUM 9.0 08/10/2019 1223   GFRNONAA 83 04/08/2018 1018   GFRAA 96 04/08/2018 1018    Lipid Panel     Component Value Date/Time   CHOL 69 08/10/2019 1223   CHOL 84 (L) 04/08/2018 1018   TRIG 57.0 08/10/2019 1223   HDL 27.40 (L) 08/10/2019 1223   HDL 29 (L) 04/08/2018 1018   CHOLHDL 3 08/10/2019 1223   VLDL 11.4 08/10/2019 1223   LDLCALC 30 08/10/2019 1223   LDLCALC 42 04/08/2018 1018    CBC    Component Value Date/Time  WBC 8.8 04/20/2020 1044   WBC 6.9 08/10/2019 1223   RBC 4.49 04/20/2020 1044   RBC 4.29 08/10/2019 1223   HGB 10.0 (L) 04/20/2020 1044   HCT 31.6 (L) 04/20/2020 1044   PLT 204 04/20/2020 1044   MCV 70 (L) 04/20/2020 1044   MCH 22.3 (L) 04/20/2020 1044   MCH 25.0 (L) 11/07/2015 1525   MCHC 31.6 04/20/2020 1044   MCHC 32.8 08/10/2019 1223   RDW 22.1 (H) 04/20/2020 1044    Hgb A1C Lab Results  Component Value Date   HGBA1C <4.2 (L) 04/08/2018            Assessment & Plan:    Nicki Reaper, NP  This visit occurred during the SARS-CoV-2 public health emergency.  Safety protocols were in place, including screening questions prior to the visit, additional usage of staff PPE, and extensive cleaning of exam room while observing appropriate contact time as indicated for disinfecting solutions.

## 2020-05-18 ENCOUNTER — Ambulatory Visit
Admission: RE | Admit: 2020-05-18 | Discharge: 2020-05-18 | Disposition: A | Discharge status: Home / Self Care | Payer: BC Managed Care – PPO | Source: Ambulatory Visit | Attending: Internal Medicine | Admitting: Internal Medicine

## 2020-05-18 ENCOUNTER — Other Ambulatory Visit: Payer: Self-pay

## 2020-05-18 ENCOUNTER — Ambulatory Visit: Payer: BC Managed Care – PPO

## 2020-05-18 DIAGNOSIS — Z1231 Encounter for screening mammogram for malignant neoplasm of breast: Secondary | ICD-10-CM

## 2020-06-05 ENCOUNTER — Other Ambulatory Visit: Payer: Self-pay

## 2020-06-05 ENCOUNTER — Encounter: Payer: Self-pay | Admitting: Internal Medicine

## 2020-06-05 ENCOUNTER — Ambulatory Visit (INDEPENDENT_AMBULATORY_CARE_PROVIDER_SITE_OTHER): Payer: BC Managed Care – PPO | Admitting: Internal Medicine

## 2020-06-05 VITALS — BP 106/68 | HR 98 | Temp 98.4°F | Wt 128.0 lb

## 2020-06-05 DIAGNOSIS — Z0289 Encounter for other administrative examinations: Secondary | ICD-10-CM | POA: Diagnosis not present

## 2020-06-05 DIAGNOSIS — F5104 Psychophysiologic insomnia: Secondary | ICD-10-CM

## 2020-06-05 DIAGNOSIS — F419 Anxiety disorder, unspecified: Secondary | ICD-10-CM

## 2020-06-05 DIAGNOSIS — Z630 Problems in relationship with spouse or partner: Secondary | ICD-10-CM

## 2020-06-05 DIAGNOSIS — F32A Depression, unspecified: Secondary | ICD-10-CM

## 2020-06-05 MED ORDER — HYDROXYZINE HCL 25 MG PO TABS: 25.0000 mg | ORAL_TABLET | Freq: Two times a day (BID) | ORAL | 0 refills | Status: DC | PRN

## 2020-06-05 NOTE — Assessment & Plan Note (Signed)
Persistent due to work related stress She is actively seeking new employment She will send FMLA forms via [employer]

## 2020-06-05 NOTE — Telephone Encounter (Signed)
Please give to Jodene Nam, continuous leave from 4/15-5/18. Should be filled out like last forms.

## 2020-06-05 NOTE — Progress Notes (Signed)
Subjective:    Patient ID: Christine Turner, female    DOB: 09-Dec-1975, 45 y.o.   MRN: [id number]  HPI  Pt presents to the clinic today for follow up of anxiety and depression. This is triggered by work related stress. This has been an ongoing issue that has persisted for more than 6 months. She is actively applying and interviewing for new jobs. She hopes to get something soon. She reports this stress is causing anxiety, depression, insomnia and now interfering in her marriage. She is not currently seeing a therapist. She is taking Hydroxzyine as needed and would like a refill of this today. She would like continuous FMLA starting 06/12/20-07/12/20.    Review of Systems  Past Medical History:  Diagnosis Date  . Anemia   . Thalassemia trait     Current Outpatient Medications  Medication Sig Dispense Refill  . cetirizine (ZYRTEC) 10 MG tablet Take 10 mg by mouth daily.    . Coenzyme Q10 (COQ10) 200 MG CAPS Take 2 capsules by mouth.    . diphenhydrAMINE (BENADRYL) 25 mg capsule Take 25 mg by mouth every 6 (six) hours as needed.    Marland Kitchen EPINEPHrine 0.3 mg/0.3 mL IJ SOAJ injection Inject 0.3 mLs (0.3 mg total) into the muscle as needed for anaphylaxis. 2 each 0  . hydrOXYzine (ATARAX/VISTARIL) 25 MG tablet TAKE 1 TABLET TWICE A DAY AS NEEDED 180 tablet 1  . Melatonin 5 MG CAPS Take by mouth.    . Prenatal Vit-Fe Fumarate-FA (PRENATAL MULTIVITAMIN) TABS tablet Take 1 tablet by mouth daily at 12 noon.    . tretinoin (RETIN-A) 0.025 % cream Apply topically at bedtime. 45 g 0   No current facility-administered medications for this visit.    Allergies  Allergen Reactions  . Almond (Diagnostic) Anaphylaxis and Swelling  . Fish Allergy Anaphylaxis  . Penicillins Hives and Swelling    Has patient had a PCN reaction causing immediate rash, facial/tongue/throat swelling, SOB or lightheadedness with hypotension: Unknown Has patient had a PCN reaction causing severe rash involving mucus membranes  or skin necrosis: No Has patient had a PCN reaction that required hospitalization No Has patient had a PCN reaction occurring within the last 10 years: No If all of the above answers are "NO", then may proceed with Cephalosporin use.   Marland Kitchen Carrot [Daucus Carota] Itching and Swelling    Swelling and itching is of the throat.    Family History  Problem Relation Age of Onset  . Hypertension Mother   . Diabetes Father   . Hypertension Father   . Colon cancer Neg Hx   . Colon polyps Neg Hx   . Esophageal cancer Neg Hx   . Rectal cancer Neg Hx   . Stomach cancer Neg Hx     Social History   Socioeconomic History  . Marital status: Married    Spouse name: Not on file  . Number of children: Not on file  . Years of education: Not on file  . Highest education level: Not on file  Occupational History  . Not on file  Tobacco Use  . Smoking status: Never Smoker  . Smokeless tobacco: Never Used  Vaping Use  . Vaping Use: Never used  Substance and Sexual Activity  . Alcohol use: Yes    Alcohol/week: 2.0 standard drinks    Types: 2 Glasses of wine per week  . Drug use: Yes    Comment: canibis gummies daily per pt  . Sexual activity: Yes  Birth control/protection: I.U.D.  Other Topics Concern  . Not on file  Social History Narrative  . Not on file   Social Determinants of Health   Financial Resource Strain: Not on file  Food Insecurity: No Food Insecurity  . Worried About Programme researcher, broadcasting/film/video in the Last Year: Never true  . Ran Out of Food in the Last Year: Never true  Transportation Needs: No Transportation Needs  . Lack of Transportation (Medical): No  . Lack of Transportation (Non-Medical): No  Physical Activity: Not on file  Stress: Not on file  Social Connections: Not on file  Intimate Partner Violence: Not on file     Constitutional: Denies fever, malaise, fatigue, headache or abrupt weight changes.  Respiratory: Denies difficulty breathing, shortness of breath,  cough or sputum production.   Cardiovascular: Denies chest pain, chest tightness, palpitations or swelling in the hands or feet.  Neurological: Pt reports insomnia. Denies dizziness, difficulty with memory, difficulty with speech or problems with balance and coordination.  Psych: Pt reports work related stress, anxiety and depression. Denies SI/HI.  No other specific complaints in a complete review of systems (except as listed in HPI above).     Objective:   Physical Exam  BP 106/68   Pulse 98   Temp 98.4 F (36.9 C) (Temporal)   Wt 128 lb (58.1 kg)   SpO2 98%   BMI 21.97 kg/m   Wt Readings from Last 3 Encounters:  04/20/20 129 lb (58.5 kg)  03/06/20 120 lb (54.4 kg)  11/10/19 119 lb (54 kg)    General: Appears her stated age, well developed, well nourished in NAD. Cardiovascular: Normal rate. Pulmonary/Chest: Normal effort. Neurological: Alert and oriented.  Psychiatric: Mildly anxious appearing. Behavior is normal. Judgment and thought content normal.    BMET    Component Value Date/Time   NA 137 08/10/2019 1223   NA 140 04/08/2018 1018   K 3.7 08/10/2019 1223   CL 107 08/10/2019 1223   CO2 26 08/10/2019 1223   GLUCOSE 80 08/10/2019 1223   BUN 11 08/10/2019 1223   BUN 8 04/08/2018 1018   CREATININE 1.04 08/10/2019 1223   CALCIUM 9.0 08/10/2019 1223   GFRNONAA 83 04/08/2018 1018   GFRAA 96 04/08/2018 1018    Lipid Panel     Component Value Date/Time   CHOL 69 08/10/2019 1223   CHOL 84 (L) 04/08/2018 1018   TRIG 57.0 08/10/2019 1223   HDL 27.40 (L) 08/10/2019 1223   HDL 29 (L) 04/08/2018 1018   CHOLHDL 3 08/10/2019 1223   VLDL 11.4 08/10/2019 1223   LDLCALC 30 08/10/2019 1223   LDLCALC 42 04/08/2018 1018    CBC    Component Value Date/Time   WBC 8.8 04/20/2020 1044   WBC 6.9 08/10/2019 1223   RBC 4.49 04/20/2020 1044   RBC 4.29 08/10/2019 1223   HGB 10.0 (L) 04/20/2020 1044   HCT 31.6 (L) 04/20/2020 1044   PLT 204 04/20/2020 1044   MCV 70 (L)  04/20/2020 1044   MCH 22.3 (L) 04/20/2020 1044   MCH 25.0 (L) 11/07/2015 1525   MCHC 31.6 04/20/2020 1044   MCHC 32.8 08/10/2019 1223   RDW 22.1 (H) 04/20/2020 1044    Hgb A1C Lab Results  Component Value Date   HGBA1C <4.2 (L) 04/08/2018            Assessment & Plan:    Nicki Reaper, NP This visit occurred during the SARS-CoV-2 public health emergency.  Safety protocols  were in place, including screening questions prior to the visit, additional usage of staff PPE, and extensive cleaning of exam room while observing appropriate contact time as indicated for disinfecting solutions.

## 2020-06-05 NOTE — Assessment & Plan Note (Signed)
Continue Hydroxyzine prn, refilled today Support offered

## 2020-06-05 NOTE — Patient Instructions (Signed)
Textbook of family medicine (9th ed., pp. 1062-1073). [place], [place]: [employer].">  Stress, Adult Stress is a normal reaction to life events. Stress is what you feel when life demands more than you are used to, or more than you think you can handle. Some stress can be useful, such as studying for a test or meeting a deadline at work. Stress that occurs too often or for too long can cause problems. It can affect your emotional health and interfere with relationships and normal daily activities. Too much stress can weaken your body's defense system (immune system) and increase your risk for physical illness. If you already have a medical problem, stress can make it worse. What are the causes? All sorts of life events can cause stress. An event that causes stress for one person may not be stressful for another person. Major life events, whether positive or negative, commonly cause stress. Examples include:  Losing a job or starting a new job.  Losing a loved one.  Moving to a new town or home.  Getting married or divorced.  Having a baby.  Getting injured or sick. Less obvious life events can also cause stress, especially if they occur day after day or in combination with each other. Examples include:  Working long hours.  Driving in traffic.  Caring for children.  Being in debt.  Being in a difficult relationship. What are the signs or symptoms? Stress can cause emotional symptoms, including:  Anxiety. This is feeling worried, afraid, on edge, overwhelmed, or out of control.  Anger, including irritation or impatience.  Depression. This is feeling sad, down, helpless, or guilty.  Trouble focusing, remembering, or making decisions. Stress can cause physical symptoms, including:  Aches and pains. These may affect your head, neck, back, stomach, or other areas of your body.  Tight muscles or a clenched jaw.  Low energy.  Trouble sleeping. Stress can cause unhealthy  behaviors, including:  Eating to feel better (overeating) or skipping meals.  Working too much or putting off tasks.  Smoking, drinking alcohol, or using drugs to feel better. How is this diagnosed? Stress is diagnosed through an assessment by your health care provider. He or she may diagnose this condition based on:  Your symptoms and any stressful life events.  Your medical history.  Tests to rule out other causes of your symptoms. Depending on your condition, your health care provider may refer you to a specialist for further evaluation. How is this treated? Stress management techniques are the recommended treatment for stress. Medicine is not typically recommended for the treatment of stress. Techniques to reduce your reaction to stressful life events include:  Stress identification. Monitor yourself for symptoms of stress and identify what causes stress for you. These skills may help you to avoid or prepare for stressful events.  Time management. Set your priorities, keep a calendar of events, and learn to say no. Taking these actions can help you avoid making too many commitments. Techniques for coping with stress include:  Rethinking the problem. Try to think realistically about stressful events rather than ignoring them or overreacting. Try to find the positives in a stressful situation rather than focusing on the negatives.  Exercise. Physical exercise can release both physical and emotional tension. The key is to find a form of exercise that you enjoy and do it regularly.  Relaxation techniques. These relax the body and mind. The key is to find one or more that you enjoy and use the techniques regularly. Examples include: ?   Meditation, deep breathing, or progressive relaxation techniques. ? Yoga or tai chi. ? Biofeedback, mindfulness techniques, or journaling. ? Listening to music, being out in nature, or participating in other hobbies.  Practicing a healthy lifestyle.  Eat a balanced diet, drink plenty of water, limit or avoid caffeine, and get plenty of sleep.  Having a strong support network. Spend time with family, friends, or other people you enjoy being around. Express your feelings and talk things over with someone you trust. Counseling or talk therapy with a mental health professional may be helpful if you are having trouble managing stress on your own.   Follow these instructions at home: Lifestyle  Avoid drugs.  Do not use any products that contain nicotine or tobacco, such as cigarettes, e-cigarettes, and chewing tobacco. If you need help quitting, ask your health care provider.  Limit alcohol intake to no more than 1 drink a day for nonpregnant women and 2 drinks a day for men. One drink equals 12 oz of beer, 5 oz of wine, or 1 oz of hard liquor  Do not use alcohol or drugs to relax.  Eat a balanced diet that includes fresh fruits and vegetables, whole grains, lean meats, fish, eggs, and beans, and low-fat dairy. Avoid processed foods and foods high in added fat, sugar, and salt.  Exercise at least 30 minutes on 5 or more days each week.  Get 7-8 hours of sleep each night.   General instructions  Practice stress management techniques as discussed with your health care provider.  Drink enough fluid to keep your urine clear or pale yellow.  Take over-the-counter and prescription medicines only as told by your health care provider.  Keep all follow-up visits as told by your health care provider. This is important.   Contact a health care provider if:  Your symptoms get worse.  You have new symptoms.  You feel overwhelmed by your problems and can no longer manage them on your own. Get help right away if:  You have thoughts of hurting yourself or others. If you ever feel like you may hurt yourself or others, or have thoughts about taking your own life, get help right away. You can go to your nearest emergency department or  call:  Your local emergency services (911 in the [place]  A suicide crisis helpline, such as the National Suicide Prevention Lifeline at 1-800-273-8255. This is open 24 hours a day. Summary  Stress is a normal reaction to life events. It can cause problems if it happens too often or for too long.  Practicing stress management techniques is the best way to treat stress.  Counseling or talk therapy with a mental health professional may be helpful if you are having trouble managing stress on your own. This information is not intended to replace advice given to you by your health care provider. Make sure you discuss any questions you have with your health care provider. Document Revised: 10/29/2019 Document Reviewed: 10/29/2019 Elsevier Patient Education  2021 [employer]  

## 2020-06-08 NOTE — Telephone Encounter (Signed)
Forms faxed x 3 confirmation of delivery received Copy sent to scan Original left in the front office for pt to pick up  Pt is aware

## 2020-07-31 ENCOUNTER — Ambulatory Visit: Payer: BC Managed Care – PPO | Admitting: Obstetrics and Gynecology

## 2020-08-07 ENCOUNTER — Ambulatory Visit (INDEPENDENT_AMBULATORY_CARE_PROVIDER_SITE_OTHER): Payer: BC Managed Care – PPO | Admitting: Obstetrics & Gynecology

## 2020-08-07 ENCOUNTER — Encounter: Payer: Self-pay | Admitting: Obstetrics & Gynecology

## 2020-08-07 ENCOUNTER — Other Ambulatory Visit: Payer: Self-pay

## 2020-08-07 ENCOUNTER — Other Ambulatory Visit (HOSPITAL_COMMUNITY)
Admission: RE | Admit: 2020-08-07 | Discharge: 2020-08-07 | Disposition: A | Discharge status: Home / Self Care | Payer: BC Managed Care – PPO | Source: Ambulatory Visit | Attending: Obstetrics and Gynecology | Admitting: Obstetrics and Gynecology

## 2020-08-07 VITALS — BP 100/65 | HR 88 | Wt 127.0 lb

## 2020-08-07 DIAGNOSIS — B3731 Acute candidiasis of vulva and vagina: Secondary | ICD-10-CM

## 2020-08-07 DIAGNOSIS — N939 Abnormal uterine and vaginal bleeding, unspecified: Secondary | ICD-10-CM | POA: Insufficient documentation

## 2020-08-07 DIAGNOSIS — B373 Candidiasis of vulva and vagina: Secondary | ICD-10-CM | POA: Diagnosis not present

## 2020-08-07 DIAGNOSIS — B379 Candidiasis, unspecified: Secondary | ICD-10-CM

## 2020-08-07 DIAGNOSIS — Z113 Encounter for screening for infections with a predominantly sexual mode of transmission: Secondary | ICD-10-CM | POA: Diagnosis present

## 2020-08-07 LAB — CBC
Hematocrit: 34.8 % (ref 34.0–46.6)
Hemoglobin: 10.3 g/dL — ABNORMAL LOW (ref 11.1–15.9)
MCH: 21.6 pg — ABNORMAL LOW (ref 26.6–33.0)
MCHC: 29.6 g/dL — ABNORMAL LOW (ref 31.5–35.7)
MCV: 73 fL — ABNORMAL LOW (ref 79–97)
NRBC: 3 % — ABNORMAL HIGH (ref 0–0)
Platelets: 224 10*3/uL (ref 150–450)
RBC: 4.76 x10E6/uL (ref 3.77–5.28)
RDW: 23.1 % — ABNORMAL HIGH (ref 11.7–15.4)
WBC: 8.5 10*3/uL (ref 3.4–10.8)

## 2020-08-07 NOTE — Progress Notes (Signed)
   GYNECOLOGY OFFICE VISIT NOTE  History:   Christine Turner is a 45 y.o. [phone] here today for evaluation of abnormal menstrual cycles for the last few months.  Cycles are usually about 4 weeks; but in the last few months have been 14-21 days, varying in amount and period length.  Denies anemia symptoms, no pelvic pain. She is concerned about STIs and her IUD (Paragard placed 11/2015), just became active with new partner.   She  also reports yellow discharge.  No pelvic pain or other concerns.    Past Medical History:  Diagnosis Date   Anemia    Thalassemia trait     Past Surgical History:  Procedure Laterality Date   BREAST BIOPSY     CESAREAN SECTION     COLONOSCOPY  at age 37   normal exam per pt   diastasis repair     DILATION AND CURETTAGE OF UTERUS      The following portions of the patient's history were reviewed and updated as appropriate: allergies, current medications, past family history, past medical history, past social history, past surgical history and problem list.   Health Maintenance:  Normal pap and negative HRHPV on 04/20/2020.  Normal mammogram on 05/18/2020.   Review of Systems:  Pertinent items noted in HPI and remainder of comprehensive ROS otherwise negative.  Physical Exam:  BP 100/65   Pulse 88   Wt 127 lb (57.6 kg)   BMI 21.80 kg/m  CONSTITUTIONAL: Well-developed, well-nourished female in no acute distress.  HEENT:  Normocephalic, atraumatic. External right and left ear normal. No scleral icterus.  NECK: Normal range of motion, supple, no masses noted on observation SKIN: No rash noted. Not diaphoretic. No erythema. No pallor. MUSCULOSKELETAL: Normal range of motion. No edema noted. NEUROLOGIC: Alert and oriented to person, place, and time. Normal muscle tone coordination. No cranial nerve deficit noted. PSYCHIATRIC: Normal mood and affect. Normal behavior. Normal judgment and thought content. CARDIOVASCULAR: Normal heart rate noted RESPIRATORY:  Effort and breath sounds normal, no problems with respiration noted ABDOMEN: No masses noted. No other overt distention noted.   PELVIC: Normal appearing external genitalia; normal urethral meatus; normal appearing vaginal mucosa and cervix. Paragard strings seen. Yellow discharge noted, testing sample obtained.  Normal uterine size, no other palpable masses, no uterine or adnexal tenderness. Performed in the presence of a chaperone.     Assessment and Plan:     1. Abnormal uterine bleeding (AUB) Could be perimenopausal bleeding, but will check for other etiologies.  Will follow up results and manage accordingly. - Cervicovaginal ancillary only - US PELVIC COMPLETE WITH TRANSVAGINAL; Future - CBC - TSH  2. Routine screening for STI (sexually transmitted infection) STI screen done, will follow up results and manage accordingly. - Hepatitis B surface antigen - Hepatitis C antibody - HIV Antibody (routine testing w rflx) - RPR - Cervicovaginal ancillary only  Routine preventative health maintenance measures emphasized. Please refer to After Visit Summary for other counseling recommendations.   Return for any gynecologic concerns.    I spent 20 minutes dedicated to the care of this patient including pre-visit review of records, face to face time with the patient discussing her conditions and treatments and post visit ordering of testing.    Jaynie Collins, MD, FACOG Obstetrician & Gynecologist, [facility] for [employer], [facility]

## 2020-08-08 LAB — HEPATITIS C ANTIBODY: Hep C Virus Ab: <0.1 s/co ratio (ref 0.0–0.9)

## 2020-08-08 LAB — HIV ANTIBODY (ROUTINE TESTING W REFLEX): HIV Screen 4th Generation wRfx: NONREACTIVE

## 2020-08-08 LAB — RPR: RPR Ser Ql: NONREACTIVE

## 2020-08-08 LAB — TSH: TSH: 1.75 u[IU]/mL (ref 0.450–4.500)

## 2020-08-08 LAB — HEPATITIS B SURFACE ANTIGEN: Hepatitis B Surface Ag: NEGATIVE

## 2020-08-09 LAB — CERVICOVAGINAL ANCILLARY ONLY
Bacterial Vaginitis (gardnerella): NEGATIVE
Candida Glabrata: POSITIVE — AB
Candida Vaginitis: POSITIVE — AB
Chlamydia: NEGATIVE
Comment: NEGATIVE
Comment: NEGATIVE
Comment: NEGATIVE
Comment: NEGATIVE
Comment: NEGATIVE
Comment: NORMAL
Neisseria Gonorrhea: NEGATIVE
Trichomonas: NEGATIVE

## 2020-08-09 MED ORDER — FLUCONAZOLE 150 MG PO TABS: 150.0000 mg | ORAL_TABLET | Freq: Once | ORAL | 1 refills | Status: AC

## 2020-08-09 MED ORDER — BORIC ACID CRYS: 600.0000 mg | CRYSTALS | Freq: Every day | 2 refills | Status: AC

## 2020-08-09 NOTE — Addendum Note (Signed)
Addended by: Jaynie Collins A on: 08/09/2020 01:05 PM   Modules accepted: Orders

## 2020-08-21 ENCOUNTER — Other Ambulatory Visit: Payer: Self-pay

## 2020-08-21 ENCOUNTER — Ambulatory Visit
Admission: RE | Admit: 2020-08-21 | Discharge: 2020-08-21 | Disposition: A | Discharge status: Home / Self Care | Payer: BC Managed Care – PPO | Source: Ambulatory Visit | Attending: Obstetrics & Gynecology | Admitting: Obstetrics & Gynecology

## 2020-08-21 DIAGNOSIS — N939 Abnormal uterine and vaginal bleeding, unspecified: Secondary | ICD-10-CM | POA: Diagnosis present

## 2020-10-08 ENCOUNTER — Ambulatory Visit (HOSPITAL_COMMUNITY)
Admission: EM | Admit: 2020-10-08 | Discharge: 2020-10-08 | Disposition: A | Discharge status: Home / Self Care | Payer: BC Managed Care – PPO | Attending: Emergency Medicine | Admitting: Emergency Medicine

## 2020-10-08 ENCOUNTER — Encounter (HOSPITAL_COMMUNITY): Payer: Self-pay | Admitting: Emergency Medicine

## 2020-10-08 ENCOUNTER — Other Ambulatory Visit: Payer: Self-pay

## 2020-10-08 DIAGNOSIS — G8918 Other acute postprocedural pain: Secondary | ICD-10-CM

## 2020-10-08 DIAGNOSIS — R109 Unspecified abdominal pain: Secondary | ICD-10-CM | POA: Diagnosis not present

## 2020-10-08 NOTE — ED Provider Notes (Signed)
HPI  SUBJECTIVE:  Christine Turner is a 45 y.o. female who presents with midline abdominal pain described as sore tenderness, worse in the upper region of her abdomen since having a diastasis repair and abdominoplasty in March.  She states that it has gotten worse over the past few days/week.  She reports feeling "bloated"  and intermittent upper abdominal distention.  She had a normal bowel movement this morning.  No nausea, vomiting, focal abdominal swelling, fevers.  No change in her diet.  She has been exercising more, doing a lot of running.  She has not tried anything for this.  No alleviating factors.  Symptoms are worse palpation.  She is status post midline abdominal diastases repair, umbilical hernia repair, abdominoplasty, C-section.  No history of diabetes, gastroparesis.  ZCH:YIFOY, Salvadore Oxford, NP  .  Plastics: Dr. Myrene Galas in [place].  She has follow-up with him in September     Past Medical History:  Diagnosis Date   Anemia    Thalassemia trait     Past Surgical History:  Procedure Laterality Date   BREAST BIOPSY     CESAREAN SECTION     COLONOSCOPY  at age 53   normal exam per pt   diastasis repair     DILATION AND CURETTAGE OF UTERUS      Family History  Problem Relation Age of Onset   Hypertension Mother    Diabetes Father    Hypertension Father    Colon cancer Neg Hx    Colon polyps Neg Hx    Esophageal cancer Neg Hx    Rectal cancer Neg Hx    Stomach cancer Neg Hx     Social History   Tobacco Use   Smoking status: Never   Smokeless tobacco: Never  Vaping Use   Vaping Use: Never used  Substance Use Topics   Alcohol use: Yes    Alcohol/week: 2.0 standard drinks    Types: 2 Glasses of wine per week   Drug use: Yes    Comment: canibis gummies daily per pt    No current facility-administered medications for this encounter.  Current Outpatient Medications:    cetirizine (ZYRTEC) 10 MG tablet, Take 10 mg by mouth daily., Disp: , Rfl:    Coenzyme Q10  (COQ10) 200 MG CAPS, Take 2 capsules by mouth., Disp: , Rfl:    diphenhydrAMINE (BENADRYL) 25 mg capsule, Take 25 mg by mouth every 6 (six) hours as needed., Disp: , Rfl:    EPINEPHrine 0.3 mg/0.3 mL IJ SOAJ injection, Inject 0.3 mLs (0.3 mg total) into the muscle as needed for anaphylaxis., Disp: 2 each, Rfl: 0   hydrOXYzine (ATARAX/VISTARIL) 25 MG tablet, Take 1 tablet (25 mg total) by mouth 2 (two) times daily as needed., Disp: 180 tablet, Rfl: 0   Prenatal Vit-Fe Fumarate-FA (PRENATAL MULTIVITAMIN) TABS tablet, Take 1 tablet by mouth daily at 12 noon., Disp: , Rfl:    tretinoin (RETIN-A) 0.025 % cream, Apply topically at bedtime., Disp: 45 g, Rfl: 0  Allergies  Allergen Reactions   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    Carrot [Daucus Carota] Itching and Swelling    Swelling and itching is of the throat.     ROS  As noted in HPI.   Physical Exam  BP (!) 110/46   Pulse 76   Temp 99 F (37.2 C)   Resp 18   SpO2 100%  Repeat blood pressure 93/65.  Patient states this is more of her baseline.  Constitutional: Well developed, well nourished, no acute distress Eyes:  EOMI, conjunctiva normal bilaterally HENT: Normocephalic, atraumatic,mucus membranes moist Respiratory: Normal inspiratory effort Cardiovascular: Normal rate GI: Tenderness along the palpable scar tissue in the midline of the abdomen, most pronounced superior to the umbilicus.  Normal skin.  Positive  abdominoplasty surgical scar lower abdomen.  No apparent umbilical hernia.  Questionable swelling ventrally superior to the umbilicus when patient starts to sit up.  This is the area of maximal tenderness.  Active bowel sounds, nondistended, no rebound, guarding. Back: No CVAT skin: No rash, skin intact Musculoskeletal: no deformities Neurologic: Alert & oriented x 3, no focal neuro deficits Psychiatric: Speech and behavior appropriate   ED Course   Medications - No data to display  No orders of the defined types were placed in this encounter.   No results found for this or any previous visit (from the past 24 hour(s)). No results found.  ED Clinical Impression  1. Abdominal pain, unspecified abdominal location   2. Pain at surgical site      ED Assessment/Plan  Previous records reviewed.  No record of surgery seen on Care Everywhere.   Patient with tenderness along the diastasis repair.  Has been going on for 5 months, getting worse over the past few days.  Could be stretching of the stitches.  I doubt abscess, cyst, seroma, hematoma, skin infection, surgical site infection, obstruction, perforation..  The does not appear to be any irreduceable hernia on my exam.  We had a long discussion about getting an ultrasound, but discussed with her that while I could technically order one, I would not know what to do with the results if they were abnormal. I am not even sure if this is the appropriate imaging test in this situation.  She will reach out to her plastic surgeon tomorrow in [place] to establish a sooner follow-up appointment.  I will also reach out to her PMD.  Advised patient to try some Tylenol and ibuprofen, and some arnica topically.   Discussed  MDM, treatment plan, and plan for follow-up with patient. Discussed sn/sx that should prompt return to the ED. patient agrees with plan.   No orders of the defined types were placed in this encounter.     *This clinic note was  created using Dragon dictation software. Therefore, there may be occasional mistakes despite careful proofreading.  ?    Domenick Gong, MD 10/09/20 1047

## 2020-10-08 NOTE — Discharge Instructions (Addendum)
Try taking 400 to 600 mg of ibuprofen with 1000 mg of Tylenol together 3-4 times a day as needed for pain.  You can also try Arnica topical cream.  This is an anti-inflammatory cream.  Go immediately to the emergency department if you start having fevers above 100.4, pain that is not controlled with Tylenol and ibuprofen, if you are unable to have a bowel movement, if you have a bulge in your abdomen that does not go back in when you lie down and push on it, or for any concerns.

## 2020-10-08 NOTE — ED Triage Notes (Signed)
Pt is present today with upper abdominal pain. Pt states that she had hernia surgery in march. Pt states that her surgery site feels tender and sore.

## 2020-10-13 ENCOUNTER — Telehealth: Payer: Self-pay

## 2020-10-13 NOTE — Telephone Encounter (Signed)
I called and spoke with the patient and notified her of Rene Kocher recommendations for her to come in the office for a f/u appointment. She informed me that she will going out of town for week. Appt scheduled for Wednesday, Aug. 31st at 10:00am.

## 2020-10-13 NOTE — Telephone Encounter (Signed)
-----   Message from Lorre Munroe, NP sent at 10/09/2020 11:13 AM EDT ----- Regarding: FW: follow up for this pt Please call pt and have her schedule a follow up with me ----- Message ----- From: Domenick Gong, MD Sent: 10/09/2020  11:00 AM EDT To: Lorre Munroe, NP Subject: follow up for this pt                          Hi, Rene Kocher  I saw Ms. Heater in the urgent care yesterday for upper midline abdominal pain since having her diastasis repair/abdominoplasty in March.  She states that it has gotten worse over the past week.  She is tender along the upper abdomen, in the midline, where you can feel scar tissue.  This has been tender since the surgery. I did not appreciate any irreducible hernia.  There did not seem to be any emergency, so I have sent her home on Tylenol and ibuprofen and she plans on reaching out to her plastic surgeon today.  She is requesting an ultrasound of the area to see if stitches are still intact.  We do not have access to this in the urgent care, and to be honest, I am not sure if it is even the correct study in this situation.  I really do not know what would be the best study to do to evaluate for dehiscence although ultrasound makes intuitive sense. I think the best person to evaluate this and order imaging would be the surgeon. she states that she has an appointment with him in September, but is wondering if she can get answers sooner. I said that I'd reach out to you to see if you were able to order this or wether it should be deferred to surgery.   Thanks- Domenick Gong, MD [facility]

## 2020-10-24 ENCOUNTER — Encounter: Payer: Self-pay | Admitting: Internal Medicine

## 2020-10-24 ENCOUNTER — Ambulatory Visit: Payer: BC Managed Care – PPO | Admitting: Internal Medicine

## 2020-10-24 ENCOUNTER — Other Ambulatory Visit: Payer: Self-pay

## 2020-10-24 VITALS — BP 105/56 | HR 79 | Temp 97.5°F | Resp 17 | Ht 64.0 in | Wt 127.8 lb

## 2020-10-24 DIAGNOSIS — R140 Abdominal distension (gaseous): Secondary | ICD-10-CM

## 2020-10-24 DIAGNOSIS — R1084 Generalized abdominal pain: Secondary | ICD-10-CM | POA: Diagnosis not present

## 2020-10-24 DIAGNOSIS — Z9889 Other specified postprocedural states: Secondary | ICD-10-CM | POA: Diagnosis not present

## 2020-10-25 ENCOUNTER — Ambulatory Visit: Payer: BC Managed Care – PPO | Admitting: Internal Medicine

## 2020-10-26 ENCOUNTER — Encounter: Payer: Self-pay | Admitting: Internal Medicine

## 2020-10-26 MED ORDER — TRETINOIN 0.025 % EX CREA
TOPICAL_CREAM | Freq: Every day | CUTANEOUS | 0 refills | Status: DC
Start: 2020-10-26 — End: 2020-11-25

## 2020-10-26 NOTE — Patient Instructions (Signed)
Diastasis Recti Diastasis recti is a condition in which the muscles of the abdomen (rectus abdominis muscles) become thin and separate. The result is a wider space between the muscles of the right and left abdomen (abdominal muscles). This wider space between the muscles may cause a bulge in the middle of the abdomen. This bulge may be noticed when a person is straining or when he or she sits up after lying down. Diastasis recti can affect men and women. It is most common among pregnant women, babies, people with obesity, and people who have had abdominal surgery. Exercise or surgery may help correct this condition. What are the causes? Common causes of this condition include: Pregnancy. As the uterus grows in size, it puts pressure on the abdominal muscles, causing the muscles to separate. Obesity. Excess fat puts pressure on abdominal muscles. Weight lifting. Some exercises of the abdomen. Advanced age. Genetics. Having had surgery on the abdomen before. What increases the risk? This condition is more likely to develop in: Women. Newborns, especially newborns who are born early (prematurely). What are the signs or symptoms? Common symptoms of this condition include: A bulge in the middle of your abdomen. You will notice it most when you sit up or strain. Pain in your low back, hips, or the area between your hip bones (pelvis). Constipation. Being unable to control when you urinate (urinary incontinence). Bloating. Poor posture. How is this diagnosed? This condition is diagnosed with a physical exam. During the exam, your health care provider will ask you to lie flat on your back and do a crunch or half sit-up. If you have diastasis recti, a bulge will appear lengthwise between your abdominal muscles in the center of your abdomen. Your health care provider will measure the gap between your muscles with one of the following: A medical device used to measure the space between two objects  (caliper). A tape measure. CT scan. Ultrasound. Finger spaces. Your health care provider will measure the space using his or her fingers. How is this treated? If your muscle separation is not too large, you may not need treatment. However, if you are a woman who plans to become pregnant again, you should treat this condition before your next pregnancy. Treatment may include: Physical therapy exercises to strengthen and tighten your abdominal muscles. Lifestyle changes such as weight loss and exercise. Over-the-counter pain medicines as needed. Surgery to correct the separation. Follow these instructions at home: Activity Return to your normal activities as told by your health care provider. Ask your health care provider what activities are safe for you. Do exercises as told by your health care provider. Make sure you are doing your exercises and movements correctly when lifting weights or doing exercises using your abdominal muscles or the muscles in the center of your body that give stability (core muscles). Proper form can help to prevent this condition from happening again. General instructions If you are overweight, ask your health care provider for help with weight loss. Losing even a small amount of weight can help to improve your diastasis recti. Take over-the-counter or prescription medicines only as told by your health care provider. Do not strain. Straining can make the separation worse. Examples of straining include: Pushing hard to have a bowel movement, such as when you have constipation. Lifting heavy objects or lifting children. Standing up and sitting down. You may need to take these actions to prevent or treat constipation: Drink enough fluid to keep your urine pale yellow. Take over-the-counter  or prescription medicines. °Eat foods that are high in fiber, such as beans, whole grains, and fresh fruits and vegetables. °Limit foods that are high in fat and processed sugars,  such as fried or sweet foods. °Keep all follow-up visits. This is important. °Contact a health care provider if: °You notice a new bulge in your abdomen. °Get help right away if: °You experience severe discomfort in your abdomen. °You develop severe abdominal pain along with nausea, vomiting, or a fever. °Summary °Diastasis recti is a condition in which the muscles of the abdomen (rectus abdominismuscles) become thin and separate. You may notice a bulge in your abdomen because the space has widened between the muscles of the right and left abdomen. °The most common symptom is a bulge in the middle of your abdomen. You will notice it most when you sit up or strain. °This condition is diagnosed with a physical exam. °If the muscle separation is not too big, you may not need treatment. Otherwise, you may need to do physical therapy or have surgery. °This information is not intended to replace advice given to you by your health care provider. Make sure you discuss any questions you have with your health care provider. °Document Revised: 10/15/2019 Document Reviewed: 10/15/2019 °Elsevier Patient Education © 2022 [employer] ° °

## 2020-10-26 NOTE — Progress Notes (Signed)
Subjective:    Patient ID: Christine Turner, female    DOB: [birth year], 45 y.o.   MRN: [id number]  HPI  Pt presents to the clinic today for UC followup. She went to the [facility] 8/14 with abdominal pain and bloating. She reports this started in March after having a diastasis rectii repair and abdominoplasty by Dr. Myrene Galas in [place], 03/2020. She denied nausea, vomiting, diarrhea, constipation or blood in her stool. Further imaging was discussed but this was deferred to plastic surgery. She has an appt with her surgeon in a few weeks.  She will also like a refill on her Retin A cream.  Review of Systems     Past Medical History:  Diagnosis Date   Anemia    Thalassemia trait     Current Outpatient Medications  Medication Sig Dispense Refill   Coenzyme Q10 (COQ10) 200 MG CAPS Take 2 capsules by mouth.     diphenhydrAMINE (BENADRYL) 25 mg capsule Take 25 mg by mouth every 6 (six) hours as needed.     hydrOXYzine (ATARAX/VISTARIL) 25 MG tablet Take 1 tablet (25 mg total) by mouth 2 (two) times daily as needed. 180 tablet 0   NON FORMULARY CBD Gummies     Prenatal Vit-Fe Fumarate-FA (PRENATAL MULTIVITAMIN) TABS tablet Take 1 tablet by mouth daily at 12 noon.     cetirizine (ZYRTEC) 10 MG tablet Take 10 mg by mouth daily. (Patient not taking: Reported on 10/24/2020)     EPINEPHrine 0.3 mg/0.3 mL IJ SOAJ injection Inject 0.3 mLs (0.3 mg total) into the muscle as needed for anaphylaxis. (Patient not taking: Reported on 10/24/2020) 2 each 0   tretinoin (RETIN-A) 0.025 % cream Apply topically at bedtime. 45 g 0   No current facility-administered medications for this visit.    Allergies  Allergen Reactions   Almond (Diagnostic) Anaphylaxis and Swelling   Fish Allergy Anaphylaxis   Penicillins Hives and Swelling    Has patient had a PCN reaction causing immediate rash, facial/tongue/throat swelling, SOB or lightheadedness with hypotension: Unknown Has patient had a PCN reaction causing severe rash  involving mucus membranes or skin necrosis: No Has patient had a PCN reaction that required hospitalization No Has patient had a PCN reaction occurring within the last 10 years: No If all of the above answers are "NO", then may proceed with Cephalosporin use.    Carrot [Daucus Carota] Itching and Swelling    Swelling and itching is of the throat.    Family History  Problem Relation Age of Onset   Hypertension Mother    Diabetes Father    Hypertension Father    Colon cancer Neg Hx    Colon polyps Neg Hx    Esophageal cancer Neg Hx    Rectal cancer Neg Hx    Stomach cancer Neg Hx     Social History   Socioeconomic History   Marital status: Married    Spouse name: Not on file   Number of children: Not on file   Years of education: Not on file   Highest education level: Not on file  Occupational History   Not on file  Tobacco Use   Smoking status: Never   Smokeless tobacco: Never  Vaping Use   Vaping Use: Never used  Substance and Sexual Activity   Alcohol use: Yes    Alcohol/week: 2.0 standard drinks    Types: 2 Glasses of wine per week   Drug use: Yes    Comment: canibis gummies daily  per pt   Sexual activity: Yes    Birth control/protection: I.U.D.  Other Topics Concern   Not on file  Social History Narrative   Not on file   Social Determinants of Health   Financial Resource Strain: Not on file  Food Insecurity: No Food Insecurity   Worried About Running Out of Food in the Last Year: Never true   Ran Out of Food in the Last Year: Never true  Transportation Needs: No Transportation Needs   Lack of Transportation (Medical): No   Lack of Transportation (Non-Medical): No  Physical Activity: Not on file  Stress: Not on file  Social Connections: Not on file  Intimate Partner Violence: Not on file     Constitutional: Denies fever, malaise, fatigue, headache or abrupt weight changes.  Respiratory: Denies difficulty breathing, shortness of breath, cough or  sputum production.   Cardiovascular: Denies chest pain, chest tightness, palpitations or swelling in the hands or feet.  Gastrointestinal: Pt reports abdominal pain and bloating. Denies constipation, diarrhea or blood in the stool.  GU: Denies urgency, frequency, pain with urination, burning sensation, blood in urine, odor or discharge. Musculoskeletal: Denies decrease in range of motion, difficulty with gait, muscle pain or joint pain and swelling.  Skin: Denies redness, rashes, lesions or ulcercations.   No other specific complaints in a complete review of systems (except as listed in HPI above).  Objective:   Physical Exam   BP (!) 105/56 (BP Location: Right Arm, Patient Position: Sitting, Cuff Size: Small)   Pulse 79   Temp (!) 97.5 F (36.4 C) (Temporal)   Resp 17   Ht 5\' 4"  (1.626 m)   Wt 127 lb 12.8 oz (58 kg)   SpO2 99%   BMI 21.94 kg/m  Wt Readings from Last 3 Encounters:  10/24/20 127 lb 12.8 oz (58 kg)  08/07/20 127 lb (57.6 kg)  06/05/20 128 lb (58.1 kg)    General: Appears her stated age, well developed, well nourished in NAD. Skin: Warm, dry and intact. 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 Silicone tape noted over abdominoplasty scar. Cardiovascular: Normal rate and rhythm. Pulmonary/Chest: Normal effort and positive vesicular breath sounds. No respiratory distress.  Abdomen: Soft and nontender. You can feel the diastasis rectii repair and it seems intact. Musculoskeletal: No difficulty with gait.  Neurological: Alert and oriented.   BMET    Component Value Date/Time   NA 137 08/10/2019 1223   NA 140 04/08/2018 1018   K 3.7 08/10/2019 1223   CL 107 08/10/2019 1223   CO2 26 08/10/2019 1223   GLUCOSE 80 08/10/2019 1223   BUN 11 08/10/2019 1223   BUN 8 04/08/2018 1018   CREATININE 1.04 08/10/2019 1223   CALCIUM 9.0 08/10/2019 1223   GFRNONAA 83 04/08/2018 1018   GFRAA 96 04/08/2018 1018    Lipid Panel     Component Value Date/Time   CHOL 69 08/10/2019  1223   CHOL 84 (L) 04/08/2018 1018   TRIG 57.0 08/10/2019 1223   HDL 27.40 (L) 08/10/2019 1223   HDL 29 (L) 04/08/2018 1018   CHOLHDL 3 08/10/2019 1223   VLDL 11.4 08/10/2019 1223   LDLCALC 30 08/10/2019 1223   LDLCALC 42 04/08/2018 1018    CBC    Component Value Date/Time   WBC 8.5 08/07/2020 1634   WBC 6.9 08/10/2019 1223   RBC 4.76 08/07/2020 1634   RBC 4.29 08/10/2019 1223   HGB 10.3 (L) 08/07/2020 1634   HCT 34.8 08/07/2020 1634  PLT 224 08/07/2020 1634   MCV 73 (L) 08/07/2020 1634   MCH 21.6 (L) 08/07/2020 1634   MCH 25.0 (L) 11/07/2015 1525   MCHC 29.6 (L) 08/07/2020 1634   MCHC 32.8 08/10/2019 1223   RDW 23.1 (H) 08/07/2020 1634    Hgb A1C Lab Results  Component Value Date   HGBA1C <4.2 (L) 04/08/2018           Assessment & Plan:   UC Follow Up for Abdominal Pain, Bloating s/p Diastasis Rectii Repair:  UC notes reviewed I do not feel like she has a hernia She does have some upper abdominal fullness with is likely just subcutaneous fat and she may need lipo Encouraged core exercises for strengthening She will follow up with her surgeon as planned  Return precautions discussed Nicki Reaper, NP This visit occurred during the SARS-CoV-2 public health emergency.  Safety protocols were in place, including screening questions prior to the visit, additional usage of staff PPE, and extensive cleaning of exam room while observing appropriate contact time as indicated for disinfecting solutions.

## 2020-11-25 ENCOUNTER — Other Ambulatory Visit: Payer: Self-pay | Admitting: Internal Medicine

## 2020-12-11 ENCOUNTER — Other Ambulatory Visit: Payer: Self-pay | Admitting: Internal Medicine

## 2020-12-11 NOTE — Telephone Encounter (Signed)
Requested medication (s) are due for refill today - expired Rx  Requested medication (s) are on the active medication list -yes  Future visit scheduled -no  Last refill: 09/21/19 2 each 0RF  Notes to clinic: Request RF-expired Rx  Requested Prescriptions  Pending Prescriptions Disp Refills   EPINEPHRINE 0.3 mg/0.3 mL IJ SOAJ injection [Pharmacy Med Name: EPINEPHRINE 0.3 MG AUTO-INJECT] 2 each 0    Sig: Inject 0.3 mLs (0.3 mg total) into the muscle as needed for anaphylaxis.     Immunology: Antidotes Passed - 12/11/2020 12:23 PM      Passed - Valid encounter within last 12 months    Recent Outpatient Visits           1 month ago Generalized abdominal pain   [facility] [place], Salvadore Oxford, NP                 Requested Prescriptions  Pending Prescriptions Disp Refills   EPINEPHRINE 0.3 mg/0.3 mL IJ SOAJ injection [Pharmacy Med Name: EPINEPHRINE 0.3 MG AUTO-INJECT] 2 each 0    Sig: Inject 0.3 mLs (0.3 mg total) into the muscle as needed for anaphylaxis.     Immunology: Antidotes Passed - 12/11/2020 12:23 PM      Passed - Valid encounter within last 12 months    Recent Outpatient Visits           1 month ago Generalized abdominal pain   [facility] [place], Salvadore [place], [place]

## 2021-01-30 ENCOUNTER — Encounter: Payer: Self-pay | Admitting: Obstetrics & Gynecology

## 2021-01-30 ENCOUNTER — Other Ambulatory Visit: Payer: Self-pay

## 2021-01-30 ENCOUNTER — Ambulatory Visit (INDEPENDENT_AMBULATORY_CARE_PROVIDER_SITE_OTHER): Payer: BC Managed Care – PPO | Admitting: Obstetrics & Gynecology

## 2021-01-30 ENCOUNTER — Other Ambulatory Visit (HOSPITAL_COMMUNITY)
Admission: RE | Admit: 2021-01-30 | Discharge: 2021-01-30 | Disposition: A | Discharge status: Home / Self Care | Payer: BC Managed Care – PPO | Source: Ambulatory Visit | Attending: Obstetrics & Gynecology | Admitting: Obstetrics & Gynecology

## 2021-01-30 VITALS — BP 102/65 | HR 82 | Wt 130.0 lb

## 2021-01-30 DIAGNOSIS — Z975 Presence of (intrauterine) contraceptive device: Secondary | ICD-10-CM

## 2021-01-30 DIAGNOSIS — N921 Excessive and frequent menstruation with irregular cycle: Secondary | ICD-10-CM | POA: Diagnosis present

## 2021-01-30 DIAGNOSIS — Z0000 Encounter for general adult medical examination without abnormal findings: Secondary | ICD-10-CM

## 2021-01-30 NOTE — Progress Notes (Signed)
   GYNECOLOGY OFFICE VISIT NOTE  History:   Christine Turner is a 45 y.o. [phone] here today for evaluation of single episode of spotting after intercourse that occurred last week.  She has the Paragard in place, had normal and regular menses.  Also reports her husband reported he felt 'scratched' by the IUD. Of note, patient had a pelvic ultrasound in 07/2020 that showed IUD in correct intrauterine position.  She also desires her annual labs, did not get these checked during her annual exam in February 2022.  She denies any abnormal vaginal discharge, bleeding, pelvic pain or other concerns.    Past Medical History:  Diagnosis Date   Anemia    Thalassemia trait     Past Surgical History:  Procedure Laterality Date   BREAST BIOPSY     CESAREAN SECTION     COLONOSCOPY  at age 61   normal exam per pt   diastasis repair     DILATION AND CURETTAGE OF UTERUS      The following portions of the patient's history were reviewed and updated as appropriate: allergies, current medications, past family history, past medical history, past social history, past surgical history and problem list.   Health Maintenance:  Normal pap and negative HRHPV on 04/20/2020.    Review of Systems:  Pertinent items noted in HPI and remainder of comprehensive ROS otherwise negative.  Physical Exam:  BP 102/65   Pulse 82   Wt 130 lb (59 kg)   BMI 22.31 kg/m  CONSTITUTIONAL: Well-developed, well-nourished female in no acute distress.  HEENT:  Normocephalic, atraumatic. External right and left ear normal. No scleral icterus.  NECK: Normal range of motion, supple, no masses noted on observation SKIN: No rash noted. Not diaphoretic. No erythema. No pallor. MUSCULOSKELETAL: Normal range of motion. No edema noted. NEUROLOGIC: Alert and oriented to person, place, and time. Normal muscle tone coordination. No cranial nerve deficit noted. PSYCHIATRIC: Normal mood and affect. Normal behavior. Normal judgment and thought  content. CARDIOVASCULAR: Normal heart rate noted RESPIRATORY: Effort and breath sounds normal, no problems with respiration noted ABDOMEN: No masses noted. No other overt distention noted.   PELVIC: Normal appearing external genitalia; normal urethral meatus; normal appearing vaginal mucosa and cervix. Paragard strings seen outside cervix about 2 cm in length, no part of IUD seen.  Mucous discharge noted, testing sample obtained.  No uterine or adnexal tenderness. Performed in the presence of a chaperone     Assessment and Plan:     1. Breakthrough bleeding episode associated with intrauterine device (IUD) Isolated episode, patient reassured about correct intrauterine position of IUD. Will evaluate for vaginitis, if negative, no other intervention needed as long as bleeding is not recurrent. - Cervicovaginal ancillary only  2. Preventative health care Labs checked today as per patient's request, will follow up results and manage accordingly. - CBC - Comprehensive metabolic panel - Lipid panel - Hemoglobin A1c - TSH Routine preventative health maintenance measures emphasized. Please refer to After Visit Summary for other counseling recommendations.   Return for any gynecologic concerns.    I spent 20 minutes dedicated to the care of this patient including pre-visit review of records, face to face time with the patient discussing her conditions and treatments and post visit orders.    Jaynie Collins, MD, FACOG Obstetrician & Gynecologist, [facility] for [employer], [facility]

## 2021-01-31 LAB — COMPREHENSIVE METABOLIC PANEL
ALT: 8 IU/L (ref 0–32)
AST: 19 IU/L (ref 0–40)
Albumin/Globulin Ratio: 1.5 (ref 1.2–2.2)
Albumin: 4.6 g/dL (ref 3.8–4.8)
Alkaline Phosphatase: 66 IU/L (ref 44–121)
BUN/Creatinine Ratio: 12 (ref 9–23)
BUN: 9 mg/dL (ref 6–24)
Bilirubin Total: 1.4 mg/dL — ABNORMAL HIGH (ref 0.0–1.2)
CO2: 23 mmol/L (ref 20–29)
Calcium: 9.1 mg/dL (ref 8.7–10.2)
Chloride: 101 mmol/L (ref 96–106)
Creatinine, Ser: 0.75 mg/dL (ref 0.57–1.00)
Globulin, Total: 3 g/dL (ref 1.5–4.5)
Glucose: 83 mg/dL (ref 70–99)
Potassium: 3.8 mmol/L (ref 3.5–5.2)
Sodium: 137 mmol/L (ref 134–144)
Total Protein: 7.6 g/dL (ref 6.0–8.5)
eGFR: 99 mL/min/{1.73_m2} (ref >59)

## 2021-01-31 LAB — LIPID PANEL
Chol/HDL Ratio: 2.8 ratio (ref 0.0–4.4)
Cholesterol, Total: 99 mg/dL — ABNORMAL LOW (ref 100–199)
HDL: 36 mg/dL — ABNORMAL LOW (ref >39)
LDL Chol Calc (NIH): 47 mg/dL (ref 0–99)
Triglycerides: 79 mg/dL (ref 0–149)
VLDL Cholesterol Cal: 16 mg/dL (ref 5–40)

## 2021-01-31 LAB — CERVICOVAGINAL ANCILLARY ONLY
Bacterial Vaginitis (gardnerella): NEGATIVE
Candida Glabrata: NEGATIVE
Candida Vaginitis: NEGATIVE
Chlamydia: NEGATIVE
Comment: NEGATIVE
Comment: NEGATIVE
Comment: NEGATIVE
Comment: NEGATIVE
Comment: NEGATIVE
Comment: NORMAL
Neisseria Gonorrhea: NEGATIVE
Trichomonas: NEGATIVE

## 2021-01-31 LAB — CBC
Hematocrit: 31.9 % — ABNORMAL LOW (ref 34.0–46.6)
Hemoglobin: 10.1 g/dL — ABNORMAL LOW (ref 11.1–15.9)
MCH: 22.1 pg — ABNORMAL LOW (ref 26.6–33.0)
MCHC: 31.7 g/dL (ref 31.5–35.7)
MCV: 70 fL — ABNORMAL LOW (ref 79–97)
NRBC: 6 % — ABNORMAL HIGH (ref 0–0)
Platelets: 254 10*3/uL (ref 150–450)
RBC: 4.58 x10E6/uL (ref 3.77–5.28)
RDW: 22.7 % — ABNORMAL HIGH (ref 11.7–15.4)
WBC: 7.2 10*3/uL (ref 3.4–10.8)

## 2021-01-31 LAB — TSH: TSH: 1.51 u[IU]/mL (ref 0.450–4.500)

## 2021-01-31 LAB — HEMOGLOBIN A1C
Est. average glucose Bld gHb Est-mCnc: <74 mg/dL
Hgb A1c MFr Bld: <4.2 % — ABNORMAL LOW (ref 4.8–5.6)

## 2021-03-09 ENCOUNTER — Other Ambulatory Visit: Payer: Self-pay | Admitting: Internal Medicine

## 2021-03-09 NOTE — Telephone Encounter (Signed)
Requested medication (s) are due for refill today:   Yes  Requested medication (s) are on the active medication list:   Yes  Future visit scheduled:   No   Last ordered: 06/05/2020 #180, 0 refills  Returned because no protocol assigned to this medication.   Requested Prescriptions  Pending Prescriptions Disp Refills   hydrOXYzine (ATARAX) 25 MG tablet [Pharmacy Med Name: HYDROXYZINE HCL 25 MG TABLET] 180 tablet 1    Sig: TAKE 1 TABLET BY MOUTH 2 TIMES DAILY AS NEEDED.     There is no refill protocol information for this order

## 2021-03-14 ENCOUNTER — Ambulatory Visit: Payer: Self-pay

## 2021-03-14 NOTE — Telephone Encounter (Signed)
Summary: advice - ongoing stomach issues   Pt has appt on 1/24 but needed some advice regarding ongoing stomach issues including diarrhea, pt stated she has been taking some over the counter options to help her symptoms.      Chief Complaint: Diarrhea, watery stools Symptoms: Cramping Frequency: Started 1-2 weeks ago. Had some vomiting last week. Pertinent Negatives: Patient denies any blood in stool. Disposition: [] ED /[] Urgent Care (no appt availability in office) / [] Appointment(In office/virtual)/ []  [place] [facility]/ [] Home Care/ [] Refused Recommended Disposition /[] [place] [facility]/ []  Follow-up with PCP Additional Notes: Has appointment 03/20/21. Asking to be worked in sooner. Please advise.  Answer Assessment - Initial Assessment Questions 1. DIARRHEA SEVERITY: "How bad is the diarrhea?" "How many more stools have you had in the past 24 hours than normal?"    - NO DIARRHEA (SCALE 0)   - MILD (SCALE 1-3): Few loose or mushy BMs; increase of 1-3 stools over normal daily number of stools; mild increase in ostomy output.   -  MODERATE (SCALE 4-7): Increase of 4-6 stools daily over normal; moderate increase in ostomy output. * SEVERE (SCALE 8-10; OR 'WORST POSSIBLE'): Increase of 7 or more stools daily over normal; moderate increase in ostomy output; incontinence.     3-4 2. ONSET: "When did the diarrhea begin?"      1-2 weeks 3. BM CONSISTENCY: "How loose or watery is the diarrhea?"      Watery 4. VOMITING: "Are you also vomiting?" If Yes, ask: "How many times in the past 24 hours?"      Last week 5. ABDOMINAL PAIN: "Are you having any abdominal pain?" If Yes, ask: "What does it feel like?" (e.g., crampy, dull, intermittent, constant)      Cramps 6. ABDOMINAL PAIN SEVERITY: If present, ask: "How bad is the pain?"  (e.g., Scale 1-10; mild, moderate, or severe)   - MILD (1-3): doesn't interfere with normal activities, abdomen soft and not tender to touch    - MODERATE (4-7):  interferes with normal activities or awakens from sleep, abdomen tender to touch    - SEVERE (8-10): excruciating pain, doubled over, unable to do any normal activities       5 7. ORAL INTAKE: If vomiting, "Have you been able to drink liquids?" "How much liquids have you had in the past 24 hours?"     Yes 8. HYDRATION: "Any signs of dehydration?" (e.g., dry mouth [not just dry lips], too weak to stand, dizziness, new weight loss) "When did you last urinate?"     No 9. EXPOSURE: "Have you traveled to a foreign country recently?" "Have you been exposed to anyone with diarrhea?" "Could you have eaten any food that was spoiled?"     No 10. ANTIBIOTIC USE: "Are you taking antibiotics now or have you taken antibiotics in the past 2 months?"       No 11. OTHER SYMPTOMS: "Do you have any other symptoms?" (e.g., fever, blood in stool)       No 12. PREGNANCY: "Is there any chance you are pregnant?" "When was your last menstrual period?"       No  Protocols used: [facility]

## 2021-03-16 NOTE — Telephone Encounter (Signed)
We are overbooked at this time and we have no openings. If urgent care is an option, she should proceed there.

## 2021-03-20 ENCOUNTER — Ambulatory Visit: Payer: BC Managed Care – PPO | Admitting: Internal Medicine

## 2021-03-20 ENCOUNTER — Other Ambulatory Visit: Payer: Self-pay

## 2021-03-20 ENCOUNTER — Encounter: Payer: Self-pay | Admitting: Internal Medicine

## 2021-03-20 VITALS — BP 109/63 | HR 92 | Temp 97.1°F | Ht 64.0 in | Wt 127.0 lb

## 2021-03-20 DIAGNOSIS — R1111 Vomiting without nausea: Secondary | ICD-10-CM

## 2021-03-20 DIAGNOSIS — R1013 Epigastric pain: Secondary | ICD-10-CM | POA: Diagnosis not present

## 2021-03-20 DIAGNOSIS — R195 Other fecal abnormalities: Secondary | ICD-10-CM | POA: Diagnosis not present

## 2021-03-20 DIAGNOSIS — R140 Abdominal distension (gaseous): Secondary | ICD-10-CM

## 2021-03-20 DIAGNOSIS — Z566 Other physical and mental strain related to work: Secondary | ICD-10-CM

## 2021-03-20 DIAGNOSIS — Z8379 Family history of other diseases of the digestive system: Secondary | ICD-10-CM

## 2021-03-20 MED ORDER — OMEPRAZOLE 20 MG PO CPDR
20.0000 mg | DELAYED_RELEASE_CAPSULE | Freq: Every day | ORAL | 0 refills | Status: DC
Start: 2021-03-20 — End: 2022-06-13

## 2021-03-20 NOTE — Progress Notes (Signed)
Subjective:    Patient ID: Christine Turner, female    DOB: May 12, 1975, 46 y.o.   MRN: [id number]  HPI  Patient presents the clinic today with complaint of abdominal pain, vomiting and diarrhea.  She reports this started 3-4 weeks ago.  Her symptoms have been intermittent.  The abdominal pain is located in the epigastric region.  She describes the pain as dull and numb with intermittent cramping. The pain can radiate downward as her food is digesting. She reports poor appetite, likely secondary  to stress. She denies reflux or heartburn. She does have bloating and gas. She reports the cramping improves with a BM but pain does not go away. She has not noticed any blood in her stool. She reports prior to onset of symptoms, she starting drinking 'Mud Water" in place of coffee which she has since stopped. Her symptoms seem to improve initially but have now returned. She denies other changes in diet or medication. She did purchase a belt to help with abdominal wall muscles s/p diastasis rectii repair, not sure if this is a contributing factor or not. She has tried Famotidine and Imodium OTC with some relief of symptoms however she reports symptoms always seem to return.  Colonoscopy from 09/2019 reviewed. She reports family history of IBS but denies history of Crohns, UC, or colon cancer.  She also thinks she may be perimenopausal. Her menses have changed, seem to be further apart. She has noticed insomnia and irritability.  Review of Systems     Past Medical History:  Diagnosis Date   Anemia    Thalassemia trait     Current Outpatient Medications  Medication Sig Dispense Refill   Coenzyme Q10 (COQ10) 200 MG CAPS Take 2 capsules by mouth.     diphenhydrAMINE (BENADRYL) 25 mg capsule Take 25 mg by mouth every 6 (six) hours as needed.     EPINEPHRINE 0.3 mg/0.3 mL IJ SOAJ injection INJECT 0.3 MLS (0.3 MG TOTAL) INTO THE MUSCLE AS NEEDED FOR ANAPHYLAXIS. 2 each 0   hydrOXYzine (ATARAX) 25 MG tablet  TAKE 1 TABLET BY MOUTH 2 TIMES DAILY AS NEEDED. 180 tablet 0   NON FORMULARY CBD Gummies     Prenatal Vit-Fe Fumarate-FA (PRENATAL MULTIVITAMIN) TABS tablet Take 1 tablet by mouth daily at 12 noon.     tretinoin (RETIN-A) 0.025 % cream APPLY TO AFFECTED AREA EVERY DAY AT BEDTIME 45 g 0   No current facility-administered medications for this visit.    Allergies  Allergen Reactions   Almond (Diagnostic) Anaphylaxis and Swelling   Fish Allergy Anaphylaxis   Penicillins Hives and Swelling    Has patient had a PCN reaction causing immediate rash, facial/tongue/throat swelling, SOB or lightheadedness with hypotension: Unknown Has patient had a PCN reaction causing severe rash involving mucus membranes or skin necrosis: No Has patient had a PCN reaction that required hospitalization No Has patient had a PCN reaction occurring within the last 10 years: No If all of the above answers are "NO", then may proceed with Cephalosporin use.    Carrot [Daucus Carota] Itching and Swelling    Swelling and itching is of the throat.    Family History  Problem Relation Age of Onset   Hypertension Mother    Diabetes Father    Hypertension Father    Colon cancer Neg Hx    Colon polyps Neg Hx    Esophageal cancer Neg Hx    Rectal cancer Neg Hx    Stomach cancer Neg  Hx     Social History   Socioeconomic History   Marital status: Married    Spouse name: Not on file   Number of children: Not on file   Years of education: Not on file   Highest education level: Not on file  Occupational History   Not on file  Tobacco Use   Smoking status: Never   Smokeless tobacco: Never  Vaping Use   Vaping Use: Never used  Substance and Sexual Activity   Alcohol use: Yes    Alcohol/week: 2.0 standard drinks    Types: 2 Glasses of wine per week   Drug use: Yes    Comment: canibis gummies daily per pt   Sexual activity: Yes    Birth control/protection: I.U.D.  Other Topics Concern   Not on file  Social  History Narrative   Not on file   Social Determinants of Health   Financial Resource Strain: Not on file  Food Insecurity: No Food Insecurity   Worried About Running Out of Food in the Last Year: Never true   Ran Out of Food in the Last Year: Never true  Transportation Needs: No Transportation Needs   Lack of Transportation (Medical): No   Lack of Transportation (Non-Medical): No  Physical Activity: Not on file  Stress: Not on file  Social Connections: Not on file  Intimate Partner Violence: Not on file     Constitutional: Denies fever, malaise, fatigue, headache or abrupt weight changes.  Respiratory: Denies difficulty breathing, shortness of breath, cough or sputum production.   Cardiovascular: Denies chest pain, chest tightness, palpitations or swelling in the hands or feet.  Gastrointestinal: Patient reports abdominal pain, bloating, gassiness, vomiting and diarrhea.  Denies constipation, or blood in the stool.  GU: Pt reports changes in menses. Denies urgency, frequency, pain with urination, burning sensation, blood in urine, odor or discharge. Musculoskeletal: Denies decrease in range of motion, difficulty with gait, muscle pain or joint pain and swelling.  Neurological: Pt reports insomnia. Denies dizziness, difficulty with memory, difficulty with speech or problems with balance and coordination.  Psych: Pt reports work stress, irritability, has a history of anxiety and depression. Denies SI/HI.  No other specific complaints in a complete review of systems (except as listed in HPI above).  Objective:   Physical Exam   BP 109/63    Pulse 92    Temp (!) 97.1 F (36.2 C) (Other (Comment)) Comment (Src): wrist   Ht 5\' 4"  (1.626 m)    Wt 127 lb (57.6 kg)    SpO2 98%    BMI 21.80 kg/m  Wt Readings from Last 3 Encounters:  03/20/21 127 lb (57.6 kg)  01/30/21 130 lb (59 kg)  10/24/20 127 lb 12.8 oz (58 kg)    General: Appears her stated age, well developed, well nourished in  NAD. Skin: Warm, dry and intact.  Cardiovascular: Normal rate and rhythm. S1,S2 noted.  No murmur, rubs or gallops noted.  Pulmonary/Chest: Normal effort and positive vesicular breath sounds. No respiratory distress. No wheezes, rales or ronchi noted.  Abdomen: Soft and nontender. Normal bowel sounds. No distention or masses noted.  Musculoskeletal: No difficulty with gait.  Neurological: Alert and oriented. Psychiatric: Mood and affect normal. Tearful. Judgment and thought content normal.    BMET    Component Value Date/Time   NA 137 01/30/2021 1146   K 3.8 01/30/2021 1146   CL 101 01/30/2021 1146   CO2 23 01/30/2021 1146   GLUCOSE 83 01/30/2021 1146  GLUCOSE 80 08/10/2019 1223   BUN 9 01/30/2021 1146   CREATININE 0.75 01/30/2021 1146   CALCIUM 9.1 01/30/2021 1146   GFRNONAA 83 04/08/2018 1018   GFRAA 96 04/08/2018 1018    Lipid Panel     Component Value Date/Time   CHOL 99 (L) 01/30/2021 1146   TRIG 79 01/30/2021 1146   HDL 36 (L) 01/30/2021 1146   CHOLHDL 2.8 01/30/2021 1146   CHOLHDL 3 08/10/2019 1223   VLDL 11.4 08/10/2019 1223   LDLCALC 47 01/30/2021 1146    CBC    Component Value Date/Time   WBC 7.2 01/30/2021 1147   WBC 6.9 08/10/2019 1223   RBC 4.58 01/30/2021 1147   RBC 4.29 08/10/2019 1223   HGB 10.1 (L) 01/30/2021 1147   HCT 31.9 (L) 01/30/2021 1147   PLT 254 01/30/2021 1147   MCV 70 (L) 01/30/2021 1147   MCH 22.1 (L) 01/30/2021 1147   MCH 25.0 (L) 11/07/2015 1525   MCHC 31.7 01/30/2021 1147   MCHC 32.8 08/10/2019 1223   RDW 22.7 (H) 01/30/2021 1147    Hgb A1C Lab Results  Component Value Date   HGBA1C <4.2 (L) 01/30/2021           Assessment & Plan:   Epigastric Pain, Bloating, Gassiness, Vomiting, Loose Stools:  DDx include H pylori, infectious diarrhea, IBS secondary to stress Will check H Pylori breath test Will check GI pathogen panel Start Omeprazole 20 mg daily in am- 30 minutes prior to breakfast x 2 weeks If labs normal  and no improvement with Omeprazole, consider referral to GI for further evaluation of symptoms  Will follow up after labs with further recommendation and treatment plan Nicki Reaper, NP This visit occurred during the SARS-CoV-2 public health emergency.  Safety protocols were in place, including screening questions prior to the visit, additional usage of staff PPE, and extensive cleaning of exam room while observing appropriate contact time as indicated for disinfecting solutions.

## 2021-03-20 NOTE — Patient Instructions (Signed)
Abdominal Pain, Adult Many things can cause belly (abdominal) pain. Most times, belly pain is not dangerous. Many cases of belly pain can be watched and treated at home. Sometimes, though, belly pain is serious. Yourdoctor will try to find the cause of your belly pain. Follow these instructions at home:  Medicines Take over-the-counter and prescription medicines only as told by your doctor. Do not take medicines that help you poop (laxatives) unless told by your doctor. General instructions Watch your belly pain for any changes. Drink enough fluid to keep your pee (urine) pale yellow. Keep all follow-up visits as told by your doctor. This is important. Contact a doctor if: Your belly pain changes or gets worse. You are not hungry, or you lose weight without trying. You are having trouble pooping (constipated) or have watery poop (diarrhea) for more than 2-3 days. You have pain when you pee or poop. Your belly pain wakes you up at night. Your pain gets worse with meals, after eating, or with certain foods. You are vomiting and cannot keep anything down. You have a fever. You have blood in your pee. Get help right away if: Your pain does not go away as soon as your doctor says it should. You cannot stop vomiting. Your pain is only in areas of your belly, such as the right side or the left lower part of the belly. You have bloody or black poop, or poop that looks like tar. You have very bad pain, cramping, or bloating in your belly. You have signs of not having enough fluid or water in your body (dehydration), such as: Dark pee, very little pee, or no pee. Cracked lips. Dry mouth. Sunken eyes. Sleepiness. Weakness. You have trouble breathing or chest pain. Summary Many cases of belly pain can be watched and treated at home. Watch your belly pain for any changes. Take over-the-counter and prescription medicines only as told by your doctor. Contact a doctor if your belly pain  changes or gets worse. Get help right away if you have very bad pain, cramping, or bloating in your belly. This information is not intended to replace advice given to you by your health care provider. Make sure you discuss any questions you have with your healthcare provider. Document Revised: 06/22/2018 Document Reviewed: 06/22/2018 Elsevier Patient Education  2022 Elsevier Inc.  

## 2021-04-26 ENCOUNTER — Other Ambulatory Visit: Payer: Self-pay | Admitting: Obstetrics & Gynecology

## 2021-04-26 ENCOUNTER — Other Ambulatory Visit: Payer: Self-pay | Admitting: Internal Medicine

## 2021-04-26 DIAGNOSIS — Z1231 Encounter for screening mammogram for malignant neoplasm of breast: Secondary | ICD-10-CM

## 2021-05-22 ENCOUNTER — Other Ambulatory Visit: Payer: Self-pay

## 2021-05-22 ENCOUNTER — Ambulatory Visit
Admission: RE | Admit: 2021-05-22 | Discharge: 2021-05-22 | Disposition: A | Discharge status: Home / Self Care | Payer: BC Managed Care – PPO | Source: Ambulatory Visit | Attending: Obstetrics & Gynecology | Admitting: Obstetrics & Gynecology

## 2021-05-22 DIAGNOSIS — Z1231 Encounter for screening mammogram for malignant neoplasm of breast: Secondary | ICD-10-CM

## 2021-07-11 ENCOUNTER — Ambulatory Visit: Payer: Self-pay | Admitting: *Deleted

## 2021-07-11 NOTE — Telephone Encounter (Signed)
?  Chief Complaint: increase issues with anxiety and insomnia  ?Symptoms:  anxiety noted more often , heart palpitations at times. Work related issues. Difficulty sleeping at times  ?Frequency: 3 weeks  ?Pertinent Negatives: Patient denies chest pain difficulty breathing, denies harming self or others ?Disposition: [] ED /[] Urgent Care (no appt availability in office) / [x] Appointment(In office/virtual)/ []  [facility]/ [] Home Care/ [] Refused Recommended Disposition /[] [facility]/ []  Follow-up with PCP ?Additional Notes:  ? ?Offered Urgent crisis center # patient declined. Recommended ED if sx worsens ? ? Reason for Disposition ? Symptoms interfere with work or school ? ?Answer Assessment - Initial Assessment Questions ?1. CONCERN: "Did anything happen that prompted you to call today?"  ?    Work related , toxic enviroment  ?2. ANXIETY SYMPTOMS: "Can you describe how you (your loved one; patient) have been feeling?" (e.g., tense, restless, panicky, anxious, keyed up, overwhelmed, sense of impending doom).  ?    Overwhelmed, anxious , tense  ?3. ONSET: "How long have you been feeling this way?" (e.g., hours, days, weeks) ?    3 weeks  ?4. SEVERITY: "How would you rate the level of anxiety?" (e.g., 0 - 10; or mild, moderate, severe). ?    Some worse than others ?5. FUNCTIONAL IMPAIRMENT: "How have these feelings affected your ability to do daily activities?" "Have you had more difficulty than usual doing your normal daily activities?" (e.g., getting better, same, worse; self-care, school, work, interactions) ?    Work related issues  ?6. HISTORY: "Have you felt this way before?" "Have you ever been diagnosed with an anxiety problem in the past?" (e.g., generalized anxiety disorder, panic attacks, PTSD). If Yes, ask: "How was this problem treated?" (e.g., medicines, counseling, etc.) ?    na ?7. RISK OF HARM - SUICIDAL IDEATION: "Do you ever have thoughts of hurting or killing yourself?" If Yes,  ask:  "Do you have these feelings now?" "Do you have a plan on how you would do this?" ?    no ?8. TREATMENT:  "What has been done so far to treat this anxiety?" (e.g., medicines, relaxation strategies). "What has helped?" ?    Taking medication for sleep . Tries to get out of toxic environment  ?9. TREATMENT - THERAPIST: "Do you have a counselor or therapist? Name?" ?    na ?10. POTENTIAL TRIGGERS: "Do you drink caffeinated beverages (e.g., coffee, colas, teas), and how much daily?" "Do you drink alcohol or use any drugs?" "Have you started any new medicines recently?" ?    na ?10. PATIENT SUPPORT: "Who is with you now?" "Who do you live with?" "Do you have family or friends who you can talk to?"  ?      na ?11. OTHER SYMPTOMS: "Do you have any other symptoms?" (e.g., feeling depressed, trouble concentrating, trouble sleeping, trouble breathing, palpitations or fast heartbeat, chest pain, sweating, nausea, or diarrhea) ?      Heart palpitations  ?12. PREGNANCY: "Is there any chance you are pregnant?" "When was your last menstrual period?" ?      na ? ?Protocols used: Anxiety and Panic Attack-A-AH ? ?

## 2021-07-12 ENCOUNTER — Encounter: Payer: Self-pay | Admitting: Internal Medicine

## 2021-07-12 ENCOUNTER — Ambulatory Visit: Payer: BC Managed Care – PPO | Admitting: Internal Medicine

## 2021-07-12 DIAGNOSIS — D508 Other iron deficiency anemias: Secondary | ICD-10-CM | POA: Diagnosis not present

## 2021-07-12 DIAGNOSIS — F419 Anxiety disorder, unspecified: Secondary | ICD-10-CM

## 2021-07-12 DIAGNOSIS — F32A Depression, unspecified: Secondary | ICD-10-CM

## 2021-07-12 DIAGNOSIS — F5104 Psychophysiologic insomnia: Secondary | ICD-10-CM

## 2021-07-12 DIAGNOSIS — D561 Beta thalassemia: Secondary | ICD-10-CM | POA: Diagnosis not present

## 2021-07-12 MED ORDER — TRAZODONE HCL 50 MG PO TABS: 25.0000 mg | ORAL_TABLET | Freq: Every evening | ORAL | 0 refills | Status: DC | PRN

## 2021-07-12 NOTE — Progress Notes (Signed)
Subjective:    Patient ID: Christine Turner, female    DOB: [birth year], 46 y.o.   MRN: [id number]  HPI  Patient due for follow-up of chronic conditions.  Anxiety and Depression: Chronic, managed on Hydroxyzine as needed. She reports this is worse lately due to work stress. She is currently seeing a therapist.  She denies SI/HI.  Insomnia: She has difficulty falling asleep.  She takes Hydroxyzine as needed with minimal relief of symptoms.  There is no sleep study on file.  Iron Deficiency Anemia: Her last H/H was 10.1/31.9, 01/2021.  She is taking an Iron supplement as prescribed.  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  She does not follow with hematology.  She would like FMLA form completion.  She did not bring these forms with her today.  She is requesting a continuous leave from Tuesday, May 23-Tuesday, May 6.  Review of Systems     Past Medical History:  Diagnosis Date   Anemia    Thalassemia trait     Current Outpatient Medications  Medication Sig Dispense Refill   Coenzyme Q10 (COQ10) 200 MG CAPS Take 2 capsules by mouth.     diphenhydrAMINE (BENADRYL) 25 mg capsule Take 25 mg by mouth every 6 (six) hours as needed.     EPINEPHRINE 0.3 mg/0.3 mL IJ SOAJ injection INJECT 0.3 MLS (0.3 MG TOTAL) INTO THE MUSCLE AS NEEDED FOR ANAPHYLAXIS. 2 each 0   hydrOXYzine (ATARAX) 25 MG tablet TAKE 1 TABLET BY MOUTH 2 TIMES DAILY AS NEEDED. 180 tablet 0   NON FORMULARY CBD Gummies     omeprazole (PRILOSEC) 20 MG capsule Take 1 capsule (20 mg total) by mouth daily. 14 capsule 0   Prenatal Vit-Fe Fumarate-FA (PRENATAL MULTIVITAMIN) TABS tablet Take 1 tablet by mouth daily at 12 noon.     tretinoin (RETIN-A) 0.025 % cream APPLY TO AFFECTED AREA EVERY DAY AT BEDTIME 45 g 0   No current facility-administered medications for this visit.    Allergies  Allergen Reactions   Almond (Diagnostic) Anaphylaxis and Swelling   Fish Allergy  Anaphylaxis   Penicillins Hives and Swelling    Has patient had a PCN reaction causing immediate rash, facial/tongue/throat swelling, SOB or lightheadedness with hypotension: Unknown Has patient had a PCN reaction causing severe rash involving mucus membranes or skin necrosis: No Has patient had a PCN reaction that required hospitalization No Has patient had a PCN reaction occurring within the last 10 years: No If all of the above answers are "NO", then may proceed with Cephalosporin use.    Carrot [Daucus Carota] Itching and Swelling    Swelling and itching is of the throat.    Family History  Problem Relation Age of Onset   Hypertension Mother    Diabetes Father    Hypertension Father    Colon cancer Neg Hx    Colon polyps Neg Hx    Esophageal cancer Neg Hx    Rectal cancer Neg Hx    Stomach cancer Neg Hx     Social History   Socioeconomic History   Marital status: Married    Spouse name: Not on file   Number of children: Not on file   Years of education: Not on file   Highest education level: Not on file  Occupational History   Not on file  Tobacco Use   Smoking status: Never   Smokeless tobacco: Never  Vaping Use   Vaping  Use: Never used  Substance and Sexual Activity   Alcohol use: Yes    Alcohol/week: 2.0 standard drinks    Types: 2 Glasses of wine per week   Drug use: Yes    Comment: canibis gummies daily per pt   Sexual activity: Yes    Birth control/protection: I.U.D.  Other Topics Concern   Not on file  Social History Narrative   Not on file   Social Determinants of Health   Financial Resource Strain: Not on file  Food Insecurity: Not on file  Transportation Needs: Not on file  Physical Activity: Not on file  Stress: Not on file  Social Connections: Not on file  Intimate Partner Violence: Not on file     Constitutional: Denies fever, malaise, fatigue, headache or abrupt weight changes.  Respiratory: Denies difficulty breathing, shortness of  breath, cough or sputum production.   Cardiovascular: Denies chest pain, chest tightness, palpitations or swelling in the hands or feet.  Skin: Denies redness, rashes, lesions or ulcercations.  Neurological: Pt reports insomnia. Denies dizziness, difficulty with memory, difficulty with speech or problems with balance and coordination.  Psych: Pt has a history of anxiety and depression. Denies SI/HI.  No other specific complaints in a complete review of systems (except as listed in HPI above).  Objective:   Physical Exam   BP 114/68   Pulse (!) 103   Ht 5\' 4"  (1.626 m)   Wt 126 lb 3.2 oz (57.2 kg)   SpO2 100%   BMI 21.66 kg/m   Wt Readings from Last 3 Encounters:  03/20/21 127 lb (57.6 kg)  01/30/21 130 lb (59 kg)  10/24/20 127 lb 12.8 oz (58 kg)    General: Appears her stated age, well developed, well nourished in NAD. Cardiovascular: Tachycardic with normal rhythm. S1,S2 noted.  No murmur, rubs or gallops noted.  Pulmonary/Chest: Normal effort and positive vesicular breath sounds. No respiratory distress. No wheezes, rales or ronchi noted.  Neurological: Alert and oriented.  Psychiatric: Tearful. Anxious appearing. Judgment and thought content normal.   BMET    Component Value Date/Time   NA 137 01/30/2021 1146   K 3.8 01/30/2021 1146   CL 101 01/30/2021 1146   CO2 23 01/30/2021 1146   GLUCOSE 83 01/30/2021 1146   GLUCOSE 80 08/10/2019 1223   BUN 9 01/30/2021 1146   CREATININE 0.75 01/30/2021 1146   CALCIUM 9.1 01/30/2021 1146   GFRNONAA 83 04/08/2018 1018   GFRAA 96 04/08/2018 1018    Lipid Panel     Component Value Date/Time   CHOL 99 (L) 01/30/2021 1146   TRIG 79 01/30/2021 1146   HDL 36 (L) 01/30/2021 1146   CHOLHDL 2.8 01/30/2021 1146   CHOLHDL 3 08/10/2019 1223   VLDL 11.4 08/10/2019 1223   LDLCALC 47 01/30/2021 1146    CBC    Component Value Date/Time   WBC 7.2 01/30/2021 1147   WBC 6.9 08/10/2019 1223   RBC 4.58 01/30/2021 1147   RBC 4.29  08/10/2019 1223   HGB 10.1 (L) 01/30/2021 1147   HCT 31.9 (L) 01/30/2021 1147   PLT 254 01/30/2021 1147   MCV 70 (L) 01/30/2021 1147   MCH 22.1 (L) 01/30/2021 1147   MCH 25.0 (L) 11/07/2015 1525   MCHC 31.7 01/30/2021 1147   MCHC 32.8 08/10/2019 1223   RDW 22.7 (H) 01/30/2021 1147    Hgb A1C Lab Results  Component Value Date   HGBA1C <4.2 (L) 01/30/2021  Assessment & Plan:      Webb Silversmith, NP

## 2021-07-12 NOTE — Progress Notes (Signed)
History of Present Illness:  Patient due for follow-up of chronic conditions.  Anxiety and Depression: Chronic, managed on Hydroxyzine as needed.  She is not currently seeing a therapist.  She denies SI/HI.  Insomnia: She has difficulty.  She takes Hydroxyzine as needed with good relief of symptoms.  There is no sleep study on file.  Iron Deficiency Anemia: Her last H/H was 10.1/31.9, 01/2021.  She is taking an Iron supplement as prescribed.  She does not follow with hematology   Past Medical History:  Diagnosis Date   Anemia    Thalassemia trait     Current Outpatient Medications  Medication Sig Dispense Refill   Coenzyme Q10 (COQ10) 200 MG CAPS Take 2 capsules by mouth.     diphenhydrAMINE (BENADRYL) 25 mg capsule Take 25 mg by mouth every 6 (six) hours as needed.     EPINEPHRINE 0.3 mg/0.3 mL IJ SOAJ injection INJECT 0.3 MLS (0.3 MG TOTAL) INTO THE MUSCLE AS NEEDED FOR ANAPHYLAXIS. 2 each 0   hydrOXYzine (ATARAX) 25 MG tablet TAKE 1 TABLET BY MOUTH 2 TIMES DAILY AS NEEDED. 180 tablet 0   NON FORMULARY CBD Gummies     omeprazole (PRILOSEC) 20 MG capsule Take 1 capsule (20 mg total) by mouth daily. 14 capsule 0   Prenatal Vit-Fe Fumarate-FA (PRENATAL MULTIVITAMIN) TABS tablet Take 1 tablet by mouth daily at 12 noon.     tretinoin (RETIN-A) 0.025 % cream APPLY TO AFFECTED AREA EVERY DAY AT BEDTIME 45 g 0   No current facility-administered medications for this visit.    Allergies  Allergen Reactions   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    Carrot [Daucus Carota] Itching and Swelling    Swelling and itching is of the throat.    Family History  Problem Relation Age of Onset   Hypertension Mother    Diabetes Father    Hypertension Father    Colon cancer Neg Hx    Colon polyps Neg Hx    Esophageal cancer Neg Hx    Rectal cancer Neg Hx    Stomach cancer Neg Hx     Social History   Socioeconomic History   Marital status: Married    Spouse name: Not on file   Number of children: Not on file   Years of education: Not on file   Highest education level: Not on file  Occupational History   Not on file  Tobacco Use   Smoking status: Never   Smokeless tobacco: Never  Vaping Use   Vaping Use: Never used  Substance and Sexual Activity   Alcohol use: Yes    Alcohol/week: 2.0 standard drinks    Types: 2 Glasses of wine per week   Drug use: Yes    Comment: canibis gummies daily per pt   Sexual activity: Yes    Birth control/protection: I.U.D.  Other Topics Concern   Not on file  Social History Narrative   Not on file   Social Determinants of Health   Financial  Resource Strain: Not on file  Food Insecurity: Not on file  Transportation Needs: Not on file  Physical Activity: Not on file  Stress: Not on file  Social Connections: Not on file  Intimate Partner Violence: Not on file     Constitutional: Denies fever, malaise, fatigue, headache or abrupt weight changes.  HEENT: Denies eye pain, eye redness, ear pain, ringing in the ears, wax buildup, runny nose, nasal congestion, bloody nose, or sore throat. Respiratory: Denies difficulty breathing, shortness of breath, cough or sputum production.   Cardiovascular: Denies chest pain, chest tightness, palpitations or swelling in the hands or feet.  Gastrointestinal: Denies abdominal pain, bloating, constipation, diarrhea or blood in the stool.  GU: Denies urgency, frequency, pain with  urination, burning sensation, blood in urine, odor or discharge. Musculoskeletal: Denies decrease in range of motion, difficulty with gait, muscle pain or joint pain and swelling.  Skin: Denies redness, rashes, lesions or ulcercations.  Neurological: Patient reports insomnia.  Denies dizziness, difficulty with memory, difficulty with speech or problems with balance and coordination.  Psych: Patient reports anxiety and depression.  Denies SI/HI.  No other specific complaints in a complete review of systems (except as listed in HPI above).  Observations/Objective:  There were no vitals taken for this visit. Wt Readings from Last 3 Encounters:  03/20/21 127 lb (57.6 kg)  01/30/21 130 lb (59 kg)  10/24/20 127 lb 12.8 oz (58 kg)    General: Appears her stated age, well developed, well nourished in NAD. Pulmonary/Chest: Normal effort. No respiratory distress.  Neurological: Alert and oriented.  Psychiatric: Mood and affect normal. Behavior is normal. Judgment and thought content normal.     BMET    Component Value Date/Time   NA 137 01/30/2021 1146   K 3.8 01/30/2021 1146   CL 101 01/30/2021 1146   CO2 23 01/30/2021 1146   GLUCOSE 83 01/30/2021 1146   GLUCOSE 80 08/10/2019 1223   BUN 9 01/30/2021 1146   CREATININE 0.75 01/30/2021 1146   CALCIUM 9.1 01/30/2021 1146   GFRNONAA 83 04/08/2018 1018   GFRAA 96 04/08/2018 1018    Lipid Panel     Component Value Date/Time   CHOL 99 (L) 01/30/2021 1146   TRIG 79 01/30/2021 1146   HDL 36 (L) 01/30/2021 1146   CHOLHDL 2.8 01/30/2021 1146   CHOLHDL 3 08/10/2019 1223   VLDL 11.4 08/10/2019 1223   LDLCALC 47 01/30/2021 1146    CBC    Component Value Date/Time   WBC 7.2 01/30/2021 1147   WBC 6.9 08/10/2019 1223   RBC 4.58 01/30/2021 1147   RBC 4.29 08/10/2019 1223   HGB 10.1 (L) 01/30/2021 1147   HCT 31.9 (L) 01/30/2021 1147   PLT 254 01/30/2021 1147   MCV 70 (L) 01/30/2021 1147   MCH 22.1 (L) 01/30/2021 1147   MCH 25.0 (L)  11/07/2015 1525   MCHC 31.7 01/30/2021 1147   MCHC 32.8 08/10/2019 1223   RDW 22.7 (H) 01/30/2021 1147    Hgb A1C Lab Results  Component Value Date   HGBA1C <4.2 (L) 01/30/2021       Assessment and Plan:    RTC in 6 months for annual exam  Nicki Reaper, NP

## 2021-07-13 NOTE — Assessment & Plan Note (Signed)
Deteriorated We will trial Trazodone-advised her to take quarter tablet at bedtime

## 2021-07-13 NOTE — Assessment & Plan Note (Signed)
We will monitor CBC yearly ?

## 2021-07-13 NOTE — Assessment & Plan Note (Signed)
Persistent Continue Hydroxyzine as needed She will continue with her therapist Support offered

## 2021-07-13 NOTE — Patient Instructions (Signed)

## 2021-07-13 NOTE — Assessment & Plan Note (Signed)
Continue oral Iron We will monitor CBC daily

## 2021-07-24 ENCOUNTER — Encounter: Payer: Self-pay | Admitting: Internal Medicine

## 2021-07-31 NOTE — Telephone Encounter (Signed)
Can you release note to her MyChart letting stating that she is able to return to work.

## 2021-08-02 ENCOUNTER — Ambulatory Visit: Payer: BC Managed Care – PPO | Admitting: Obstetrics and Gynecology

## 2021-08-02 VITALS — BP 96/61 | HR 87 | Wt 126.0 lb

## 2021-08-02 DIAGNOSIS — N939 Abnormal uterine and vaginal bleeding, unspecified: Secondary | ICD-10-CM | POA: Diagnosis not present

## 2021-08-02 DIAGNOSIS — Z975 Presence of (intrauterine) contraceptive device: Secondary | ICD-10-CM

## 2021-08-02 DIAGNOSIS — N921 Excessive and frequent menstruation with irregular cycle: Secondary | ICD-10-CM | POA: Diagnosis not present

## 2021-08-02 LAB — POCT URINE PREGNANCY: Preg Test, Ur: NEGATIVE

## 2021-08-02 NOTE — Progress Notes (Signed)
Obstetrics and Gynecology Visit Return Patient Evaluation  Appointment Date: 08/02/2021  Primary Care Provider: Emmit Turner [facility]: [facility]  Chief Complaint: AUB  History of Present Illness:  Christine Turner is a 46 y.o. with above CC.   Interval History: Since that time, she states that she had bleeding from 5/13 to 5/28. LMP prior to that was on 4/28 and lasted it's usual approximately one week length and not heavy or painful.  Her prior periods were approximately q4-5wks.  Bleeding from 5/13-28 started out as clots and went to spotting. She went to UC and had negative blood and swab testing. Last pap and hpv testing 03/2020 and both negative. She has a Paragard in place since 2017  She denies any menopausal s/s.   Review of Systems: as noted in the History of Present Illness.   Patient Active Problem List   Diagnosis Date Noted   Insomnia 09/21/2019   Anxiety and depression 08/10/2019   IDA (iron deficiency anemia) 12/12/2017   Beta thalassemia (HCC) 07/25/2015   Medications:  Christine Turner had no medications administered during this visit. Current Outpatient Medications  Medication Sig Dispense Refill   Coenzyme Q10 (COQ10) 200 MG CAPS Take 2 capsules by mouth.     diphenhydrAMINE (BENADRYL) 25 mg capsule Take 25 mg by mouth every 6 (six) hours as needed.     EPINEPHRINE 0.3 mg/0.3 mL IJ SOAJ injection INJECT 0.3 MLS (0.3 MG TOTAL) INTO THE MUSCLE AS NEEDED FOR ANAPHYLAXIS. 2 each 0   hydrOXYzine (ATARAX) 25 MG tablet TAKE 1 TABLET BY MOUTH 2 TIMES DAILY AS NEEDED. 180 tablet 0   NON FORMULARY CBD Gummies     omeprazole (PRILOSEC) 20 MG capsule Take 1 capsule (20 mg total) by mouth daily. 14 capsule 0   PARAGARD INTRAUTERINE COPPER IU 1 Units by Intrauterine route once.     Prenatal Vit-Fe Fumarate-FA (PRENATAL MULTIVITAMIN) TABS tablet Take 1 tablet by mouth daily at 12 noon.     traZODone (DESYREL) 50 MG tablet Take 0.5-1  tablets (25-50 mg total) by mouth at bedtime as needed for sleep. 30 tablet 0   tretinoin (RETIN-A) 0.025 % cream APPLY TO AFFECTED AREA EVERY DAY AT BEDTIME 45 g 0   No current facility-administered medications for this visit.    Allergies: is allergic to almond (diagnostic), fish allergy, penicillins, and carrot [daucus carota].  Physical Exam:  BP 96/61   Pulse 87   Wt 126 lb (57.2 kg)   BMI 21.63 kg/m  Body mass index is 21.63 kg/m. General appearance: Well nourished, well developed female in no acute distress.  Abdomen: diffusely non tender to palpation, non distended, and no masses, hernias Neuro/Psych:  Normal mood and affect.    Pelvic exam:  EGBUS: normal Vaginal vault: normal Cervix:  IUD strings 3-4cm from cervix. Normal cervix Bimanual: negative.    Labs: UPT negative  Assessment: pt doing well  Plan:  1. Abnormal uterine bleeding (AUB) Option of expectant management given to patient and if recurrs than pelvic u/s recommended; she would like to obtain one now which is reasonable so early cycle one ordered   2. ?family history of ovarian cancer Was told of grandmother maybe passed from ovarian cancer in the 49s in her 71s. Genetic testing offered to patient and patient to consider  RTC: PRN  Total time spent caring for the patient today was 30 minutes. This includes time spent before the visit reviewing the chart,  prior chart at urgen care, time spent during the visit, and time spent after the visit on documentation.   Cornelia Copa MD Attending [facility])

## 2021-08-02 NOTE — Progress Notes (Signed)
Patient presents for problem visit today.  C/O : Bleeding x 3 wks onset 07/07/21-07/22/21 bleeding was heavy at first, bright red. Pt went to urgent care [facility] notes in care everywhere)  Pt states she is doing much better today.  Last pap:04/20/2020  UPT Negative in office today.

## 2021-08-13 ENCOUNTER — Other Ambulatory Visit: Payer: Self-pay | Admitting: Internal Medicine

## 2021-08-14 NOTE — Telephone Encounter (Signed)
Requested medication (s) are due for refill today: yes  Requested medication (s) are on the active medication list: yes  Last refill:  07/12/21 #30 with 0 RF  Future visit scheduled: no, seen 07/12/21  Notes to clinic:  Pharm requests as follows: Pharmacy comment: REQUEST FOR 90 DAYS PRESCRIPTION.      Requested Prescriptions  Pending Prescriptions Disp Refills   traZODone (DESYREL) 50 MG tablet [Pharmacy Med Name: TRAZODONE 50 MG TABLET] 90 tablet 1    Sig: TAKE 1/2-1 TABLET BY MOUTH AT BEDTIME AS NEEDED FOR SLEEP.     Psychiatry: Antidepressants - Serotonin Modulator Passed - 08/13/2021  1:32 PM      Passed - Completed PHQ-2 or PHQ-9 in the last 360 days      Passed - Valid encounter within last 6 months    Recent Outpatient Visits           1 month ago Anxiety and depression   [facility] [place], Salvadore Oxford, NP   4 months ago Bloating   [facility] [place], Salvadore Oxford, NP   9 months ago Generalized abdominal pain   [facility] [place], Salvadore Oxford, [place]

## 2021-08-16 ENCOUNTER — Encounter: Payer: Self-pay | Admitting: Obstetrics and Gynecology

## 2021-08-16 ENCOUNTER — Ambulatory Visit (HOSPITAL_COMMUNITY)
Admission: RE | Admit: 2021-08-16 | Discharge: 2021-08-16 | Disposition: A | Discharge status: Home / Self Care | Payer: BC Managed Care – PPO | Source: Ambulatory Visit | Attending: Obstetrics and Gynecology | Admitting: Obstetrics and Gynecology

## 2021-08-16 DIAGNOSIS — Z975 Presence of (intrauterine) contraceptive device: Secondary | ICD-10-CM | POA: Insufficient documentation

## 2021-08-16 DIAGNOSIS — N921 Excessive and frequent menstruation with irregular cycle: Secondary | ICD-10-CM | POA: Diagnosis present

## 2021-08-20 ENCOUNTER — Telehealth: Payer: Self-pay | Admitting: Obstetrics and Gynecology

## 2021-08-23 ENCOUNTER — Telehealth: Payer: Self-pay

## 2021-08-23 NOTE — Telephone Encounter (Signed)
Pt called in requesting an appointment today with Dr. Vergie Living. Explained to pt that Dr. Vergie Living is not in the office today and provided his next available appointment, in August. Pt felt her issue was not being taking serious and I explained to her that we do take her issue serious, that's just when the next available appointment was. Pt was very upset and kept asking well why this and well why that. She then got to a point she would not let me speak and then requested I schedule her for the office appt in August. I then suggested to respond to the clinical team to notify them of her urgent need, that I was non clinical and could not make that decision. Pt disconnected the call.

## 2021-08-30 ENCOUNTER — Other Ambulatory Visit: Payer: Self-pay | Admitting: Obstetrics and Gynecology

## 2021-08-30 ENCOUNTER — Other Ambulatory Visit (HOSPITAL_COMMUNITY)
Admission: RE | Admit: 2021-08-30 | Discharge: 2021-08-30 | Disposition: A | Discharge status: Home / Self Care | Payer: BC Managed Care – PPO | Source: Ambulatory Visit | Attending: Obstetrics and Gynecology | Admitting: Obstetrics and Gynecology

## 2021-08-30 ENCOUNTER — Ambulatory Visit: Payer: BC Managed Care – PPO | Admitting: Obstetrics and Gynecology

## 2021-08-30 ENCOUNTER — Encounter: Payer: Self-pay | Admitting: Obstetrics and Gynecology

## 2021-08-30 VITALS — BP 99/63 | HR 78 | Wt 127.0 lb

## 2021-08-30 DIAGNOSIS — Z975 Presence of (intrauterine) contraceptive device: Secondary | ICD-10-CM

## 2021-08-30 DIAGNOSIS — Z30432 Encounter for removal of intrauterine contraceptive device: Secondary | ICD-10-CM

## 2021-08-30 DIAGNOSIS — Z3009 Encounter for other general counseling and advice on contraception: Secondary | ICD-10-CM | POA: Diagnosis not present

## 2021-08-30 DIAGNOSIS — Z113 Encounter for screening for infections with a predominantly sexual mode of transmission: Secondary | ICD-10-CM | POA: Diagnosis not present

## 2021-08-30 DIAGNOSIS — N921 Excessive and frequent menstruation with irregular cycle: Secondary | ICD-10-CM | POA: Insufficient documentation

## 2021-08-30 HISTORY — PX: ENDOMETRIAL BIOPSY: PRO73

## 2021-08-30 HISTORY — PX: IUD REMOVAL: OBO 1004

## 2021-08-30 MED ORDER — CAYA VA DPRH: 1.0000 [IU] | VAGINAL_INSERT | VAGINAL | 1 refills | Status: DC | PRN

## 2021-08-30 MED ORDER — VCF VAGINAL CONTRACEPTIVE 4 % VA GEL: 1.0000 [IU] | VAGINAL | 0 refills | Status: DC | PRN

## 2021-08-30 NOTE — Progress Notes (Signed)
Obstetrics and Gynecology Visit Return Patient Evaluation  Appointment Date: 08/30/2021  Primary Care Provider: Emmit Alexanders Clinic: Center for [facility]  Chief Complaint: AUB with malpositioned IUD  History of Present Illness:  Christine Turner is a 46 y.o. with above CC.   Interval History: Patient here for IUD removal. AUB has been ongoing for past 2-3wks.   Review of Systems: as noted in the History of Present Illness.   Patient Active Problem List   Diagnosis Date Noted   Insomnia 09/21/2019   Anxiety and depression 08/10/2019   IDA (iron deficiency anemia) 12/12/2017   Beta thalassemia (HCC) 07/25/2015   Medications:  Gizel Riedlinger. Gutt had no medications administered during this visit. Current Outpatient Medications  Medication Sig Dispense Refill   Coenzyme Q10 (COQ10) 200 MG CAPS Take 2 capsules by mouth.     diphenhydrAMINE (BENADRYL) 25 mg capsule Take 25 mg by mouth every 6 (six) hours as needed.     EPINEPHRINE 0.3 mg/0.3 mL IJ SOAJ injection INJECT 0.3 MLS (0.3 MG TOTAL) INTO THE MUSCLE AS NEEDED FOR ANAPHYLAXIS. 2 each 0   hydrOXYzine (ATARAX) 25 MG tablet TAKE 1 TABLET BY MOUTH 2 TIMES DAILY AS NEEDED. 180 tablet 0   NON FORMULARY CBD Gummies     omeprazole (PRILOSEC) 20 MG capsule Take 1 capsule (20 mg total) by mouth daily. 14 capsule 0   PARAGARD INTRAUTERINE COPPER IU 1 Units by Intrauterine route once.     Prenatal Vit-Fe Fumarate-FA (PRENATAL MULTIVITAMIN) TABS tablet Take 1 tablet by mouth daily at 12 noon.     traZODone (DESYREL) 50 MG tablet TAKE 1/2-1 TABLET BY MOUTH AT BEDTIME AS NEEDED FOR SLEEP. 90 tablet 1   tretinoin (RETIN-A) 0.025 % cream APPLY TO AFFECTED AREA EVERY DAY AT BEDTIME 45 g 0   No current facility-administered medications for this visit.    Allergies: is allergic to almond (diagnostic), fish allergy, penicillins, and carrot [daucus carota].  Physical Exam:  BP 99/63   Pulse 78   Wt 127 lb (57.6  kg)   BMI 21.80 kg/m  Body mass index is 21.8 kg/m. General appearance: Well nourished, well developed female in no acute distress.  Abdomen: diffusely non tender to palpation, non distended, and no masses, hernias Neuro/Psych:  Normal mood and affect.    Pelvic exam:  EGBUS: normal Vaginal vault: normal Cervix:  IUD strings 3-4cm from cervix. Normal cervix Bimanual: negative.   IUD removed and embx done w/o issue   Labs: UPT negative  Assessment: pt doing well  Plan:  1. Abnormal uterine bleeding (AUB) IUD removed and patient would like to do non hormone option and she was amenable to Caya cup with spermacide use.  I also said we could see how her AUB is now that IUD is out but she'd like to do the full physical work up today so embx done. I told her to give it at least a month to see if AUB resolves now that IUD is out.  Patient amenable to vaginal swabs today, as well  RTC: PRN  Cornelia Copa MD Attending Center for [facility])

## 2021-08-30 NOTE — Procedures (Signed)
Intrauterine Device (IUD) Removal and Endometrial Biopsy Procedure Note  Prior to the procedure being performed, the patient (or guardian) was asked to state their full name, date of birth, and the type of procedure being performed. EGBUS normal. Vaginal vault normal. Cervix normal with IUD strings seen (approx 3-4cm in length); a vaginal swab was obtained Strings grasped with ringed forceps and easily removed and noted to be intact.  No complications, patient tolerated the procedure well.  The cervix was then prepped with povidone iodine, and a pipelle was inserted into the uterine cavity and sounded the uterus to a depth of 7.5cm.  A Small amount of tissue was collected after 1 passes. The sample was sent for pathologic examination.  Condition: Stable  Complications: None  Plan: The patient was advised to call for any fever or for prolonged or severe pain or bleeding. She was advised to use OTC analgesics as needed for mild to moderate pain. She was advised to avoid vaginal intercourse for 48 hours or until the bleeding has completely stopped.  Cornelia Copa MD Attending Center for [employer] (Faculty Practice) 08/30/2021

## 2021-08-30 NOTE — Progress Notes (Signed)
RGYN patient presents for IUD Removal.  Pt has been bleeding since fathers day.  Pt is considering other birth control options may get another IUD later on.    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   Endometrial Bx   Pt has IUD  no UPT ok per provider.

## 2021-08-31 LAB — CERVICOVAGINAL ANCILLARY ONLY
Bacterial Vaginitis (gardnerella): NEGATIVE
Candida Glabrata: NEGATIVE
Candida Vaginitis: NEGATIVE
Chlamydia: NEGATIVE
Comment: NEGATIVE
Comment: NEGATIVE
Comment: NEGATIVE
Comment: NEGATIVE
Comment: NEGATIVE
Comment: NORMAL
Neisseria Gonorrhea: NEGATIVE
Trichomonas: NEGATIVE

## 2021-09-21 ENCOUNTER — Other Ambulatory Visit: Payer: Self-pay | Admitting: Internal Medicine

## 2021-09-21 NOTE — Telephone Encounter (Signed)
Requested Prescriptions  Pending Prescriptions Disp Refills  . EPINEPHRINE 0.3 mg/0.3 mL IJ SOAJ injection [Pharmacy Med Name: EPINEPHRINE 0.3 MG AUTO-INJECT] 2 each 0    Sig: INJECT 0.3 ML (0.3MG  TOTAL) INTO THE MUSCLE AS NEEDED FOR ANAPHYLAXIS     Immunology: Antidotes Passed - 09/21/2021  9:52 AM      Passed - Valid encounter within last 12 months    Recent Outpatient Visits          2 months ago Anxiety and depression   [facility] [place], Salvadore Oxford, NP   6 months ago Bloating   [facility] [place], Salvadore Oxford, NP   11 months ago Generalized abdominal pain   [facility] [place], Salvadore Oxford, [place]

## 2021-09-27 ENCOUNTER — Ambulatory Visit: Payer: Self-pay

## 2021-09-27 NOTE — Telephone Encounter (Signed)
I have no availability this week.

## 2021-09-27 NOTE — Telephone Encounter (Signed)
    Chief Complaint: Increase in her anxiety - "work related.Been getting worse since last week." Affecting appetite and sleep. Asking to be worked in. Symptoms: Has a therapist Frequency: Last week Pertinent Negatives: Patient denies thoughts of self-harm Disposition: [] ED /[] Urgent Care (no appt availability in office) / [] Appointment(In office/virtual)/ []  [place] [facility]/ [] Home Care/ [] Refused Recommended Disposition /[] [place] [facility]/ [x]  Follow-up with PCP Additional Notes: Please advise pt.  Answer Assessment - Initial Assessment Questions 1. CONCERN: "Did anything happen that prompted you to call today?"      Anxiety 2. ANXIETY SYMPTOMS: "Can you describe how you (your loved one; patient) have been feeling?" (e.g., tense, restless, panicky, anxious, keyed up, overwhelmed, sense of impending doom).      Stressed 3. ONSET: "How long have you been feeling this way?" (e.g., hours, days, weeks)     On going, but worse last week 4. SEVERITY: "How would you rate the level of anxiety?" (e.g., 0 - 10; or mild, moderate, severe).     8-9 5. FUNCTIONAL IMPAIRMENT: "How have these feelings affected your ability to do daily activities?" "Have you had more difficulty than usual doing your normal daily activities?" (e.g., getting better, same, worse; self-care, school, work, interactions)     Yes 6. HISTORY: "Have you felt this way before?" "Have you ever been diagnosed with an anxiety problem in the past?" (e.g., generalized anxiety disorder, panic attacks, PTSD). If Yes, ask: "How was this problem treated?" (e.g., medicines, counseling, etc.)     Yes 7. RISK OF HARM - SUICIDAL IDEATION: "Do you ever have thoughts of hurting or killing yourself?" If Yes, ask:  "Do you have these feelings now?" "Do you have a plan on how you would do this?"     No 8. TREATMENT:  "What has been done so far to treat this anxiety?" (e.g., medicines, relaxation strategies). "What has helped?"      Medication 9. TREATMENT - THERAPIST: "Do you have a counselor or therapist? Name?"     Yes 10. POTENTIAL TRIGGERS: "Do you drink caffeinated beverages (e.g., coffee, colas, teas), and how much daily?" "Do you drink alcohol or use any drugs?" "Have you started any new medicines recently?"       No 11. PATIENT SUPPORT: "Who is with you now?" "Who do you live with?" "Do you have family or friends who you can talk to?"        Yes 12. OTHER SYMPTOMS: "Do you have any other symptoms?" (e.g., feeling depressed, trouble concentrating, trouble sleeping, trouble breathing, palpitations or fast heartbeat, chest pain, sweating, nausea, or diarrhea)       Appetite, sleep 13. PREGNANCY: "Is there any chance you are pregnant?" "When was your last menstrual period?"       No  Protocols used: Anxiety and Panic Attack-A-AH

## 2021-10-09 ENCOUNTER — Ambulatory Visit: Payer: BC Managed Care – PPO | Admitting: Obstetrics and Gynecology

## 2021-10-11 ENCOUNTER — Ambulatory Visit: Payer: Self-pay | Admitting: *Deleted

## 2021-10-11 ENCOUNTER — Encounter: Payer: Self-pay | Admitting: *Deleted

## 2021-10-11 NOTE — Telephone Encounter (Signed)
  Chief Complaint: Anxiety Symptoms: Worsening anxiety, panic attacks several times a week. Reluctant to triage as pt triaged 09/27/21, asked to be worked in. Rene Kocher had no availability. States disappointed she had not heard back. Frequency: months, worsening Pertinent Negatives: Patient denies (no further triage) Disposition: [] ED /[] Urgent Care (no appt availability in office) / [] Appointment(In office/virtual)/ []  [place] [facility]/ [] Home Care/ [] Refused Recommended Disposition /[] [place] [facility]/ [x]  Follow-up with PCP Additional Notes: Triage incomplete. Pt frustrated affect,secured appt for 10/25/21, next available and placed on wait list. Asking again to be seen sooner with any provider.  Assured pt NT would route to practice for review (Called on staff lunch break)  Advised ED for worsening symptoms, info provided for [facility] clinic.  Please advise. Thank you. Reason for Disposition  Panic attacks are increasing in frequency  Answer Assessment - Initial Assessment Questions 1. CONCERN: "Did anything happen that prompted you to call today?"      Need to see doctor 2. ANXIETY SYMPTOMS: "Can you describe how you (your loved one; patient) have been feeling?" (e.g., tense, restless, panicky, anxious, keyed up, overwhelmed, sense of impending doom).      Worse for few weeks 3. ONSET: "How long have you been feeling this way?" (e.g., hours, days, weeks)     Months , worsening. See prior triage 4. SEVERITY: "How would you rate the level of anxiety?" (e.g., 0 - 10; or mild, moderate, severe).     Attacks several times a week 5. FUNCTIONAL IMPAIRMENT: "How have these feelings affected your ability to do daily activities?" "Have you had more difficulty than usual doing your normal daily activities?" (e.g., getting better, same, worse; self-care, school, work, interactions)     yes 6. HISTORY: "Have you felt this way before?" "Have you ever been diagnosed with an anxiety problem in the  past?" (e.g., generalized anxiety disorder, panic attacks, PTSD). If Yes, ask: "How was this problem treated?" (e.g., medicines, counseling, etc.)      7. RISK OF HARM - SUICIDAL IDEATION: "Do you ever have thoughts of hurting or killing yourself?" If Yes, ask:  "Do you have these feelings now?" "Do you have a plan on how you would do this?"      8. TREATMENT:  "What has been done so far to treat this anxiety?" (e.g., medicines, relaxation strategies). "What has helped?"      9. TREATMENT - THERAPIST: "Do you have a counselor or therapist? Name?"      10. POTENTIAL TRIGGERS: "Do you drink caffeinated beverages (e.g., coffee, colas, teas), and how much daily?" "Do you drink alcohol or use any drugs?" "Have you started any new medicines recently?"        11. PATIENT SUPPORT: "Who is with you now?" "Who do you live with?" "Do you have family or friends who you can talk to?"         12. OTHER SYMPTOMS: "Do you have any other symptoms?" (e.g., feeling depressed, trouble concentrating, trouble sleeping, trouble breathing, palpitations or fast heartbeat, chest pain, sweating, nausea, or diarrhea)      Insomnia, can't focus  Protocols used: Anxiety and Panic Attack-A-AH

## 2021-10-12 NOTE — Telephone Encounter (Signed)
Agree with advice given. I am out of the office this week and I am already full next week. Recommend behavioral health urgent care/ED if needed

## 2021-10-18 ENCOUNTER — Encounter: Payer: Self-pay | Admitting: Internal Medicine

## 2021-10-18 ENCOUNTER — Ambulatory Visit (INDEPENDENT_AMBULATORY_CARE_PROVIDER_SITE_OTHER): Payer: BC Managed Care – PPO | Admitting: Internal Medicine

## 2021-10-18 VITALS — BP 108/62 | HR 80 | Temp 97.5°F | Wt 128.0 lb

## 2021-10-18 DIAGNOSIS — F419 Anxiety disorder, unspecified: Secondary | ICD-10-CM | POA: Diagnosis not present

## 2021-10-18 DIAGNOSIS — F5104 Psychophysiologic insomnia: Secondary | ICD-10-CM

## 2021-10-18 DIAGNOSIS — F32A Depression, unspecified: Secondary | ICD-10-CM

## 2021-10-18 DIAGNOSIS — Z23 Encounter for immunization: Secondary | ICD-10-CM | POA: Diagnosis not present

## 2021-10-18 NOTE — Assessment & Plan Note (Signed)
Stable on Hydroxyzine as needed Start Trazadone nightly She is seeing a therapist virtually Support offered

## 2021-10-18 NOTE — Patient Instructions (Signed)

## 2021-10-18 NOTE — Addendum Note (Signed)
Addended by: Kavin Leech E on: 10/18/2021 10:38 AM   Modules accepted: Orders

## 2021-10-18 NOTE — Assessment & Plan Note (Signed)
Start taking Trazadone nightly Support offered

## 2021-10-18 NOTE — Progress Notes (Signed)
Subjective:    Patient ID: Christine Turner, female    DOB: [birth year], 46 y.o.   MRN: [id number]  HPI  Patient presents to clinic today for follow-up of anxiety and depression.  This has been a chronic issue secondary to work-related stress.  She reports this was worse 2 weeks ago but has improved this week. She feels like this is being triggered by her boss that recently lost his job because they wanted him to push her out of the company and he wouldn't. She reports lack of sleep, poor appetite, hives and feeling like she can't catch her breath. This is currently managed with Hydroxyzine as needed and Trazodone for sleep. She is currently seeing a therapist. She is considering taking FMLA in September.  Review of Systems     Past Medical History:  Diagnosis Date   Anemia    Thalassemia trait     Current Outpatient Medications  Medication Sig Dispense Refill   Coenzyme Q10 (COQ10) 200 MG CAPS Take 2 capsules by mouth.     Diaphragm Arc-Spring (CAYA) DPRH Place 1 Units vaginally as needed. Use with any contraceptive gel that is water based 1 each 1   diphenhydrAMINE (BENADRYL) 25 mg capsule Take 25 mg by mouth every 6 (six) hours as needed.     EPINEPHRINE 0.3 mg/0.3 mL IJ SOAJ injection INJECT 0.3 ML (0.3MG  TOTAL) INTO THE MUSCLE AS NEEDED FOR ANAPHYLAXIS 2 each 0   hydrOXYzine (ATARAX) 25 MG tablet TAKE 1 TABLET BY MOUTH 2 TIMES DAILY AS NEEDED. 180 tablet 0   NON FORMULARY CBD Gummies     Nonoxynol-9 (VCF VAGINAL CONTRACEPTIVE) 4 % GEL Place 1 Units vaginally as needed. 2.55 g 0   omeprazole (PRILOSEC) 20 MG capsule Take 1 capsule (20 mg total) by mouth daily. 14 capsule 0   Prenatal Vit-Fe Fumarate-FA (PRENATAL MULTIVITAMIN) TABS tablet Take 1 tablet by mouth daily at 12 noon.     traZODone (DESYREL) 50 MG tablet TAKE 1/2-1 TABLET BY MOUTH AT BEDTIME AS NEEDED FOR SLEEP. 90 tablet 1   tretinoin (RETIN-A) 0.025 % cream APPLY TO AFFECTED AREA EVERY DAY AT BEDTIME 45 g 0   No  current facility-administered medications for this visit.    Allergies  Allergen Reactions   Almond (Diagnostic) Anaphylaxis and Swelling   Fish Allergy Anaphylaxis   Penicillins Hives and Swelling    Has patient had a PCN reaction causing immediate rash, facial/tongue/throat swelling, SOB or lightheadedness with hypotension: Unknown Has patient had a PCN reaction causing severe rash involving mucus membranes or skin necrosis: No Has patient had a PCN reaction that required hospitalization No Has patient had a PCN reaction occurring within the last 10 years: No If all of the above answers are "NO", then may proceed with Cephalosporin use.    Carrot [Daucus Carota] Itching and Swelling    Swelling and itching is of the throat.    Family History  Problem Relation Age of Onset   Hypertension Mother    Diabetes Father    Hypertension Father    Colon cancer Neg Hx    Colon polyps Neg Hx    Esophageal cancer Neg Hx    Rectal cancer Neg Hx    Stomach cancer Neg Hx     Social History   Socioeconomic History   Marital status: Married    Spouse name: Not on file   Number of children: Not on file   Years of education: Not on file  Highest education level: Not on file  Occupational History   Not on file  Tobacco Use   Smoking status: Never   Smokeless tobacco: Never  Vaping Use   Vaping Use: Never used  Substance and Sexual Activity   Alcohol use: Yes    Alcohol/week: 2.0 standard drinks of alcohol    Types: 2 Glasses of wine per week   Drug use: Yes    Comment: canibis gummies daily per pt   Sexual activity: Yes    Birth control/protection: I.U.D.  Other Topics Concern   Not on file  Social History Narrative   Not on file   Social Determinants of Health   Financial Resource Strain: Not on file  Food Insecurity: No Food Insecurity (04/20/2020)   Hunger Vital Sign    Worried About Running Out of Food in the Last Year: Never true    Ran Out of Food in the Last Year:  Never true  Transportation Needs: No Transportation Needs (04/20/2020)   PRAPARE - Hydrologist (Medical): No    Lack of Transportation (Non-Medical): No  Physical Activity: Not on file  Stress: Not on file  Social Connections: Not on file  Intimate Partner Violence: Not on file     Constitutional: Denies fever, malaise, fatigue, headache or abrupt weight changes.  HEENT: Denies eye pain, eye redness, ear pain, ringing in the ears, wax buildup, runny nose, nasal congestion, bloody nose, or sore throat. Respiratory: Pt reports shortness of breath. Denies difficulty breathing, cough or sputum production.   Cardiovascular: Denies chest pain, chest tightness, palpitations or swelling in the hands or feet.  Gastrointestinal: Pt reports poor appetite. Denies abdominal pain, bloating, constipation, diarrhea or blood in the stool.  GU: Denies urgency, frequency, pain with urination, burning sensation, blood in urine, odor or discharge. Musculoskeletal: Denies decrease in range of motion, difficulty with gait, muscle pain or joint pain and swelling.  Skin: Pt reports hives. Denies redness, lesions or ulcercations.  Neurological: Patient reports insomnia.  Denies dizziness, difficulty with memory, difficulty with speech or problems with balance and coordination.  Psych: Patient has a history of anxiety and depression.  Denies SI/HI.  No other specific complaints in a complete review of systems (except as listed in HPI above).  Objective:   Physical Exam  BP 108/62 (BP Location: Right Arm, Patient Position: Sitting, Cuff Size: Normal)   Pulse 80   Temp (!) 97.5 F (36.4 C) (Temporal)   Wt 128 lb (58.1 kg)   SpO2 99%   BMI 21.97 kg/m   Wt Readings from Last 3 Encounters:  08/30/21 127 lb (57.6 kg)  08/02/21 126 lb (57.2 kg)  07/12/21 126 lb 3.2 oz (57.2 kg)    General: Appears her stated age, well developed, well nourished in NAD. Skin: Warm, dry and intact.  No rashes noted. Cardiovascular: Normal rate and rhythm. S1,S2 noted.  No murmur, rubs or gallops noted.  Pulmonary/Chest: Normal effort and positive vesicular breath sounds. No respiratory distress. No wheezes, rales or ronchi noted.  Neurological: Alert and oriented. Psychiatric: Tearful and anxious appearing. Judgment and thought content normal.    BMET    Component Value Date/Time   NA 137 01/30/2021 1146   K 3.8 01/30/2021 1146   CL 101 01/30/2021 1146   CO2 23 01/30/2021 1146   GLUCOSE 83 01/30/2021 1146   GLUCOSE 80 08/10/2019 1223   BUN 9 01/30/2021 1146   CREATININE 0.75 01/30/2021 1146   CALCIUM 9.1  01/30/2021 1146   GFRNONAA 83 04/08/2018 1018   GFRAA 96 04/08/2018 1018    Lipid Panel     Component Value Date/Time   CHOL 99 (L) 01/30/2021 1146   TRIG 79 01/30/2021 1146   HDL 36 (L) 01/30/2021 1146   CHOLHDL 2.8 01/30/2021 1146   CHOLHDL 3 08/10/2019 1223   VLDL 11.4 08/10/2019 1223   LDLCALC 47 01/30/2021 1146    CBC    Component Value Date/Time   WBC 7.2 01/30/2021 1147   WBC 6.9 08/10/2019 1223   RBC 4.58 01/30/2021 1147   RBC 4.29 08/10/2019 1223   HGB 10.1 (L) 01/30/2021 1147   HCT 31.9 (L) 01/30/2021 1147   PLT 254 01/30/2021 1147   MCV 70 (L) 01/30/2021 1147   MCH 22.1 (L) 01/30/2021 1147   MCH 25.0 (L) 11/07/2015 1525   MCHC 31.7 01/30/2021 1147   MCHC 32.8 08/10/2019 1223   RDW 22.7 (H) 01/30/2021 1147    Hgb A1C Lab Results  Component Value Date   HGBA1C <4.2 (L) 01/30/2021           Assessment & Plan:     RTC in 3 months for annual exam Nicki Reaper, NP

## 2021-10-25 ENCOUNTER — Ambulatory Visit: Payer: BC Managed Care – PPO | Admitting: Internal Medicine

## 2021-10-30 ENCOUNTER — Other Ambulatory Visit: Payer: Self-pay | Admitting: Internal Medicine

## 2021-10-31 NOTE — Telephone Encounter (Signed)
Requested medications are due for refill today.  unsure  Requested medications are on the active medications list.  yes  Last refill. Refilled 09/21/2021 2 0 refills  Future visit scheduled.   no  Notes to clinic.  Medication was recently refilled.    Requested Prescriptions  Pending Prescriptions Disp Refills   EPINEPHRINE 0.3 mg/0.3 mL IJ SOAJ injection [Pharmacy Med Name: EPINEPHRINE 0.3 MG AUTO-INJECT] 2 each 0    Sig: INJECT 0.3 ML INTO THE MUSCLE AS NEEDED FOR ANAPHYLAXIS     Immunology: Antidotes Passed - 10/30/2021  9:02 AM      Passed - Valid encounter within last 12 months    Recent Outpatient Visits           1 week ago Need for influenza vaccination   [facility], Salvadore Oxford, NP   3 months ago Anxiety and depression   [facility] [place], Salvadore Oxford, NP   7 months ago Bloating   [facility] [place], Salvadore Oxford, NP   1 year ago Generalized abdominal pain   [facility] [place], Salvadore Oxford, [place]

## 2021-11-16 ENCOUNTER — Other Ambulatory Visit: Payer: Self-pay | Admitting: Internal Medicine

## 2021-11-16 NOTE — Telephone Encounter (Signed)
Requested Prescriptions  Pending Prescriptions Disp Refills  . hydrOXYzine (ATARAX) 25 MG tablet [Pharmacy Med Name: HYDROXYZINE HCL 25 MG TABLET] 180 tablet 1    Sig: TAKE 1 TABLET BY MOUTH TWICE A DAY AS NEEDED     Ear, Nose, and Throat:  Antihistamines 2 Passed - 11/16/2021  2:24 PM      Passed - Cr in normal range and within 360 days    Creatinine, Ser  Date Value Ref Range Status  01/30/2021 0.75 0.57 - 1.00 mg/dL Final         Passed - Valid encounter within last 12 months    Recent Outpatient Visits          4 weeks ago Need for influenza vaccination   [facility] [place], Coralie Keens, NP   4 months ago Anxiety and depression   [facility] [place], Coralie Keens, NP   8 months ago Bloating   [facility] [place], Coralie Keens, NP   1 year ago Generalized abdominal pain   [facility] [place], Coralie Keens, [place]

## 2022-01-22 ENCOUNTER — Ambulatory Visit
Admission: EM | Admit: 2022-01-22 | Discharge: 2022-01-22 | Disposition: A | Discharge status: Home / Self Care | Payer: BC Managed Care – PPO | Attending: Emergency Medicine | Admitting: Emergency Medicine

## 2022-01-22 ENCOUNTER — Encounter: Payer: Self-pay | Admitting: Emergency Medicine

## 2022-01-22 DIAGNOSIS — J020 Streptococcal pharyngitis: Secondary | ICD-10-CM

## 2022-01-22 LAB — POCT RAPID STREP A (OFFICE): Rapid Strep A Screen: POSITIVE — AB

## 2022-01-22 MED ORDER — AZITHROMYCIN 500 MG PO TABS
500.0000 mg | ORAL_TABLET | Freq: Every day | ORAL | 0 refills | Status: AC
Start: 2022-01-22 — End: 2022-01-27

## 2022-01-22 MED ORDER — LIDOCAINE VISCOUS HCL 2 % MT SOLN: 15.0000 mL | OROMUCOSAL | 0 refills | Status: DC | PRN

## 2022-01-22 NOTE — ED Triage Notes (Signed)
Pt reports a sore throat x 2/3 days. States her husband was seen here earlier today and was dx with Strep.

## 2022-01-22 NOTE — ED Provider Notes (Signed)
[facility]    CSN: [id number] Arrival date & time: 01/22/22  1529    HISTORY   Chief Complaint  Patient presents with   Sore Throat   HPI Christine Turner is a pleasant, 46 y.o. female who presents to urgent care today. Patient states that her husband tested positive for strep throat earlier today and she herself has had a sore throat for the past 2 to 3 days.  Rapid strep test today was positive.  Patient states she is having pain with swallowing, headache.  Patient denies nausea and vomiting, rash.  The history is provided by the patient.   Past Medical History:  Diagnosis Date   Anemia    Thalassemia trait    Patient Active Problem List   Diagnosis Date Noted   Insomnia 09/21/2019   Anxiety and depression 08/10/2019   IDA (iron deficiency anemia) 12/12/2017   Beta thalassemia (HCC) 07/25/2015   Past Surgical History:  Procedure Laterality Date   BREAST BIOPSY     CESAREAN SECTION     COLONOSCOPY  at age 46   normal exam per pt   diastasis repair     DILATION AND CURETTAGE OF UTERUS     ENDOMETRIAL BIOPSY  08/30/2021   IUD REMOVAL  08/30/2021   OB History     Gravida  5   Para  2   Term  2   Preterm      AB  3   Living  3      SAB  2   IAB  1   Ectopic      Multiple  1   Live Births  3          Home Medications    Prior to Admission medications   Medication Sig Start Date End Date Taking? Authorizing Provider  azithromycin (ZITHROMAX) 500 MG tablet Take 1 tablet (500 mg total) by mouth daily for 5 days. 01/22/22 01/27/22 Yes Theadora RamaMorgan, Gennaro Lizotte Scales, PA-C  Coenzyme Q10 (COQ10) 200 MG CAPS Take 2 capsules by mouth.    [provider]  diphenhydrAMINE (BENADRYL) 25 mg capsule Take 25 mg by mouth every 6 (six) hours as needed.    [provider]  EPINEPHRINE 0.3 mg/0.3 mL IJ SOAJ injection INJECT 0.3 ML INTO THE MUSCLE AS NEEDED FOR ANAPHYLAXIS 11/01/21   Lorre MunroeBaity, Regina W, NP  hydrOXYzine (ATARAX) 25 MG tablet TAKE 1  TABLET BY MOUTH TWICE A DAY AS NEEDED 11/16/21   Lorre MunroeBaity, Regina W, NP  NON FORMULARY CBD Gummies    [provider]  omeprazole (PRILOSEC) 20 MG capsule Take 1 capsule (20 mg total) by mouth daily. 03/20/21   Lorre MunroeBaity, Regina W, NP  Prenatal Vit-Fe Fumarate-FA (PRENATAL MULTIVITAMIN) TABS tablet Take 1 tablet by mouth daily at 12 noon.    [provider]  traZODone (DESYREL) 50 MG tablet TAKE 1/2-1 TABLET BY MOUTH AT BEDTIME AS NEEDED FOR SLEEP. 08/14/21   Lorre MunroeBaity, Regina W, NP  tretinoin (RETIN-A) 0.025 % cream APPLY TO AFFECTED AREA EVERY DAY AT BEDTIME 11/25/20   Lorre MunroeBaity, Regina W, NP    Family History Family History  Problem Relation Age of Onset   Hypertension Mother    Diabetes Father    Hypertension Father    Colon cancer Neg Hx    Colon polyps Neg Hx    Esophageal cancer Neg Hx    Rectal cancer Neg Hx    Stomach cancer Neg Hx    Social History Social  History   Tobacco Use   Smoking status: Never   Smokeless tobacco: Never  Vaping Use   Vaping Use: Never used  Substance Use Topics   Alcohol use: Yes    Alcohol/week: 2.0 standard drinks of alcohol    Types: 2 Glasses of wine per week   Drug use: Yes    Comment: canibis gummies daily per pt   Allergies   Almond (diagnostic), Fish allergy, Penicillins, and Carrot [daucus carota]  Review of Systems Review of Systems Pertinent findings revealed after performing a 14 point review of systems has been noted in the history of present illness.  Physical Exam Triage Vital Signs ED Triage Vitals  Enc Vitals Group     BP 12/22/20 0827 (!) 147/82     Pulse Rate 12/22/20 0827 72     Resp 12/22/20 0827 18     Temp 12/22/20 0827 98.3 F (36.8 C)     Temp Source 12/22/20 0827 Oral     SpO2 12/22/20 0827 98 %     Weight --      Height --      Head Circumference --      Peak Flow --      Pain Score 12/22/20 0826 5     Pain Loc --      Pain Edu? --      Excl. in GC? --   No data found.  Updated Vital Signs BP  (!) 101/53 (BP Location: Left Arm)   Pulse 92   Temp 98.8 F (37.1 C) (Oral)   Resp 16   SpO2 98%   Physical Exam Constitutional:      General: She is not in acute distress.    Appearance: She is well-developed. She is ill-appearing. She is not toxic-appearing.  HENT:     Head: Normocephalic and atraumatic.     Salivary Glands: Right salivary gland is diffusely enlarged and tender. Left salivary gland is diffusely enlarged and tender.     Right Ear: Hearing and external ear normal.     Left Ear: Hearing and external ear normal.     Ears:     Comments: Bilateral EACs with mild erythema, bilateral TMs are normal    Nose: No mucosal edema, congestion or rhinorrhea.     Right Turbinates: Not enlarged, swollen or pale.     Left Turbinates: Not enlarged or swollen.     Right Sinus: No maxillary sinus tenderness or frontal sinus tenderness.     Left Sinus: No maxillary sinus tenderness or frontal sinus tenderness.     Mouth/Throat:     Lips: Pink. No lesions.     Mouth: Mucous membranes are moist. No oral lesions or angioedema.     Dentition: No gingival swelling.     Tongue: No lesions.     Palate: No mass.     Pharynx: Uvula midline. Pharyngeal swelling, oropharyngeal exudate and posterior oropharyngeal erythema present. No uvula swelling.     Tonsils: Tonsillar exudate present. 2+ on the right. 2+ on the left.  Eyes:     Extraocular Movements: Extraocular movements intact.     Conjunctiva/sclera: Conjunctivae normal.     Pupils: Pupils are equal, round, and reactive to light.  Neck:     Thyroid: No thyroid mass, thyromegaly or thyroid tenderness.     Trachea: Tracheal tenderness present. No abnormal tracheal secretions or tracheal deviation.     Comments: Voice is muffled Cardiovascular:     Rate and Rhythm: Normal rate  and regular rhythm.     Pulses: Normal pulses.     Heart sounds: Normal heart sounds, S1 normal and S2 normal. No murmur heard.    No friction rub. No gallop.   Pulmonary:     Effort: Pulmonary effort is normal. No accessory muscle usage, prolonged expiration, respiratory distress or retractions.     Breath sounds: No stridor, decreased air movement or transmitted upper airway sounds. No decreased breath sounds, wheezing, rhonchi or rales.  Abdominal:     General: Bowel sounds are normal.     Palpations: Abdomen is soft.     Tenderness: There is generalized abdominal tenderness. There is no right CVA tenderness, left CVA tenderness or rebound. Negative signs include Murphy's sign.     Hernia: No hernia is present.  Musculoskeletal:        General: No tenderness. Normal range of motion.     Cervical back: Full passive range of motion without pain, normal range of motion and neck supple.     Right lower leg: No edema.     Left lower leg: No edema.  Lymphadenopathy:     Cervical: Cervical adenopathy present.     Right cervical: Superficial cervical adenopathy present.     Left cervical: Superficial cervical adenopathy present.  Skin:    General: Skin is warm and dry.     Findings: No erythema, lesion or rash.  Neurological:     General: No focal deficit present.     Mental Status: She is alert and oriented to person, place, and time. Mental status is at baseline.  Psychiatric:        Mood and Affect: Mood normal.        Behavior: Behavior normal.        Thought Content: Thought content normal.        Judgment: Judgment normal.     Visual Acuity Right Eye Distance:   Left Eye Distance:   Bilateral Distance:    Right Eye Near:   Left Eye Near:    Bilateral Near:     [facility] / Diagnostics / Procedures:     Radiology No results found.  Procedures Procedures (including critical care time) EKG  Pending results:  Labs Reviewed  POCT RAPID STREP A (OFFICE) - Abnormal; Notable for the following components:      Result Value   Rapid Strep A Screen Positive (*)    All other components within normal limits    Medications Ordered  in UC: Medications - No data to display  UC Diagnoses / Final Clinical Impressions(s)   I have reviewed the triage vital signs and the nursing notes.  Pertinent labs & imaging results that were available during my care of the patient were reviewed by me and considered in my medical decision making (see chart for details).    Final diagnoses:  Acute streptococcal pharyngitis   Provided with azithromycin given history of swelling when taking penicillin and viscous lidocaine for comfort.  Patient was advised that she can take ibuprofen as needed for pain.  Return precautions advised.  ED Prescriptions     Medication Sig Dispense Auth. Provider   azithromycin (ZITHROMAX) 500 MG tablet Take 1 tablet (500 mg total) by mouth daily for 5 days. 5 tablet Theadora Rama Scales, PA-C   lidocaine (XYLOCAINE) 2 % solution Use as directed 15 mLs in the mouth or throat every 3 (three) hours as needed for mouth pain (Sore throat). 300 mL Theadora Rama Scales, PA-C  PDMP not reviewed this encounter.  Disposition Upon Discharge:  Condition: stable for discharge home Home: take medications as prescribed; routine discharge instructions as discussed; follow up as advised.  Patient presented with an acute illness with associated systemic symptoms and significant discomfort requiring urgent management. In my opinion, this is a condition that a prudent lay person (someone who possesses an average knowledge of health and medicine) may potentially expect to result in complications if not addressed urgently such as respiratory distress, impairment of bodily function or dysfunction of bodily organs.   Routine symptom specific, illness specific and/or disease specific instructions were discussed with the patient and/or caregiver at length.   As such, the patient has been evaluated and assessed, work-up was performed and treatment was provided in alignment with urgent care protocols and evidence based  medicine.  Patient/parent/caregiver has been advised that the patient may require follow up for further testing and treatment if the symptoms continue in spite of treatment, as clinically indicated and appropriate.  If the patient was tested for COVID-19, Influenza and/or RSV, then the patient/parent/guardian was advised to isolate at home pending the results of his/her diagnostic coronavirus test and potentially longer if they're positive. I have also advised pt that if his/her COVID-19 test returns positive, it's recommended to self-isolate for at least 10 days after symptoms first appeared AND until fever-free for 24 hours without fever reducer AND other symptoms have improved or resolved. Discussed self-isolation recommendations as well as instructions for household member/close contacts as per the [facility] and [place] DHHS, and also gave patient the COVID packet with this information.  Patient/parent/caregiver has been advised to return to the [facility] or PCP in 3-5 days if no better; to PCP or the Emergency Department if new signs and symptoms develop, or if the current signs or symptoms continue to change or worsen for further workup, evaluation and treatment as clinically indicated and appropriate  The patient will follow up with their current PCP if and as advised. If the patient does not currently have a PCP we will assist them in obtaining one.   The patient may need specialty follow up if the symptoms continue, in spite of conservative treatment and management, for further workup, evaluation, consultation and treatment as clinically indicated and appropriate.  Patient/parent/caregiver verbalized understanding and agreement of plan as discussed.  All questions were addressed during visit.  Please see discharge instructions below for further details of plan.  Discharge Instructions:   Discharge Instructions      Your strep test today is positive.  I recommend that you begin antibiotics now for  treatment.  I have sent a prescription to your pharmacy.  Please take them as prescribed.  You will begin to feel better in about 24 hours.  Please be sure that you you finish the entire 10-day course of treatment to avoid worsening infection that may require longer treatment with stronger antibiotics.   After 24 hours of taking antibiotics, you are no longer contagious.  Please discard your toothbrush as well as any other oral devices that you are currently using and replace them with new ones to avoid reinfection.   If not feeling better after 10 days, please follow-up with your primary care provider or return to urgent care for repeat testing.  Thank you for visiting urgent care today.      This office note has been dictated using Teaching laboratory technician.  Unfortunately, this method of dictation can sometimes lead to typographical or grammatical errors.  I apologize for your inconvenience in advance if this occurs.  Please do not hesitate to reach out to me if clarification is needed.      Theadora Rama Scales, PA-C 01/22/22 1744

## 2022-01-22 NOTE — Discharge Instructions (Signed)
Your strep test today is positive.  I recommend that you begin antibiotics now for treatment.  I have sent a prescription to your pharmacy.  Please take them as prescribed.  You will begin to feel better in about 24 hours.  Please be sure that you you finish the entire 10-day course of treatment to avoid worsening infection that may require longer treatment with stronger antibiotics.   After 24 hours of taking antibiotics, you are no longer contagious.  Please discard your toothbrush as well as any other oral devices that you are currently using and replace them with new ones to avoid reinfection.   If not feeling better after 10 days, please follow-up with your primary care provider or return to urgent care for repeat testing.  Thank you for visiting urgent care today.

## 2022-03-01 ENCOUNTER — Encounter: Payer: Self-pay | Admitting: Family Medicine

## 2022-03-01 ENCOUNTER — Telehealth (INDEPENDENT_AMBULATORY_CARE_PROVIDER_SITE_OTHER): Payer: BC Managed Care – PPO | Admitting: Family Medicine

## 2022-03-01 VITALS — Wt 128.0 lb

## 2022-03-01 DIAGNOSIS — J101 Influenza due to other identified influenza virus with other respiratory manifestations: Secondary | ICD-10-CM | POA: Diagnosis not present

## 2022-03-01 DIAGNOSIS — J9801 Acute bronchospasm: Secondary | ICD-10-CM | POA: Diagnosis not present

## 2022-03-01 MED ORDER — OSELTAMIVIR PHOSPHATE 75 MG PO CAPS: 75.0000 mg | ORAL_CAPSULE | Freq: Two times a day (BID) | ORAL | 0 refills | Status: DC

## 2022-03-01 MED ORDER — ALBUTEROL SULFATE HFA 108 (90 BASE) MCG/ACT IN AERS: 2.0000 | INHALATION_SPRAY | RESPIRATORY_TRACT | 0 refills | Status: DC | PRN

## 2022-03-01 NOTE — Progress Notes (Addendum)
Subjective:    Patient ID: Christine Turner, female    DOB: 1975-07-29, 47 y.o.   MRN: [id number]  Christine Turner is a 47 y.o. female presenting on 03/01/2022 for Cough, Generalized Body Aches, and Fever  Virtual / Telehealth Encounter - Video Visit via MyChart The purpose of this virtual visit is to provide medical care while limiting exposure to the novel coronavirus (COVID19) for both patient and office staff.  Consent was obtained for remote visit:  Yes.   Answered questions that patient had about telehealth interaction:  Yes.   I discussed the limitations, risks, security and privacy concerns of performing an evaluation and management service by video/telephone. I also discussed with the patient that there may be a patient responsible charge related to this service. The patient expressed understanding and agreed to proceed.  Patient Location: Home Provider Location: [facility] (Office)  Participants in virtual visit: - Patient: [place]: Christine Turner, CMA - Provider: Dr Parks Ranger   HPI  Influenza A Multiple sick contacts at home with children having Influenza A positive She started symptoms recently with cough worse at night, does not have asthma but history of bronchospasm. Used albuterol before Family is on tamiflu, she is requesting treatment for Flu Taking OTC medications Denies wheezing dyspnea.  Health Maintenance: Flu shot was completed August 2023     10/18/2021   11:02 AM 07/12/2021    4:22 PM 03/20/2021   10:03 AM  Depression screen PHQ 2/9  Decreased Interest 2 3 2   Down, Depressed, Hopeless 2 3 3   PHQ - 2 Score 4 6 5   Altered sleeping 3 3 3   Tired, decreased energy 1 2 3   Change in appetite 3 2 3   Feeling bad or failure about yourself  3 3 3   Trouble concentrating 2 2 2   Moving slowly or fidgety/restless 0 1 1  Suicidal thoughts 0 0 0  PHQ-9 Score 16 19 20   Difficult doing work/chores Extremely dIfficult Extremely  dIfficult Extremely dIfficult    Social History   Tobacco Use   Smoking status: Never   Smokeless tobacco: Never  Vaping Use   Vaping Use: Never used  Substance Use Topics   Alcohol use: Yes    Alcohol/week: 2.0 standard drinks of alcohol    Types: 2 Glasses of wine per week   Drug use: Yes    Comment: canibis gummies daily per pt    Review of Systems Per HPI unless specifically indicated above     Objective:    Wt 128 lb (58.1 kg)   BMI 21.97 kg/m   Wt Readings from Last 3 Encounters:  03/01/22 128 lb (58.1 kg)  10/18/21 128 lb (58.1 kg)  08/30/21 127 lb (57.6 kg)    Physical Exam  Note examination was completely remotely via video observation objective data only  Gen - well-appearing, no acute distress or apparent pain, comfortable HEENT - eyes appear clear without discharge or redness Heart/Lungs - cannot examine virtually - observed coughing Abd - cannot examine virtually  Skin - face visible today- no rash Neuro - awake, alert, oriented Psych - not anxious appearing  Results for orders placed or performed during the hospital encounter of 01/22/22  POCT rapid strep A  Result Value Ref Range   Rapid Strep A Screen Positive (A) Negative      Assessment & Plan:   Problem List Items Addressed This Visit   None Visit Diagnoses  Influenza A    -  Primary   Relevant Medications   oseltamivir (TAMIFLU) 75 MG capsule   Bronchospasm, acute       Relevant Medications   albuterol (PROAIR HFA) 108 (90 Base) MCG/ACT inhaler       Clinically diagnosed influenza with significant known exposure to positive influenza A Recent onset Well hydrated No nausea vomiting  - S/p influenza vaccine this season  Plan: 1. Start Tamiflu 75mg  capsules BID x 5 days - Albuterol AS NEEDED 2. Supportive care as advised with NSAID / Tylenol PRN fever/myalgias, improve hydration, may take OTC Cold/Flu meds Return criteria given if significant worsening, consider  post-influenza complications, otherwise follow-up if needed   Meds ordered this encounter  Medications   oseltamivir (TAMIFLU) 75 MG capsule    Sig: Take 1 capsule (75 mg total) by mouth 2 (two) times daily. For 5 days    Dispense:  10 capsule    Refill:  0   albuterol (PROAIR HFA) 108 (90 Base) MCG/ACT inhaler    Sig: Inhale 2 puffs into the lungs every 4 (four) hours as needed for wheezing or shortness of breath.    Dispense:  8 g    Refill:  0      Follow up plan: Return if symptoms worsen or fail to improve.  Patient verbalizes understanding with the above medical recommendations including the limitation of remote medical advice.  Specific follow-up and call-back criteria were given for patient to follow-up or seek medical care more urgently if needed.  Total duration of direct patient care provided via video conference: 14 minutes   Christine Turner, [facility] 03/01/2022, 4:44 PM

## 2022-03-01 NOTE — Patient Instructions (Addendum)
Thank you for coming to the office today.  Influenza, Adult Influenza, also called "the flu," is a viral infection that mainly affects the respiratory tract. This includes the lungs, nose, and throat. The flu spreads easily from person to person (is contagious). It causes common cold symptoms, along with high fever and body aches. What are the causes? This condition is caused by the influenza virus. You can get the virus by: Breathing in droplets that are in the air from an infected person's cough or sneeze. Touching something that has the virus on it (has been contaminated) and then touching your mouth, nose, or eyes. What increases the risk? The following factors may make you more likely to get the flu: Not washing or sanitizing your hands often. Having close contact with many people during cold and flu season. Touching your mouth, eyes, or nose without first washing or sanitizing your hands. Not getting an annual flu shot. You may have a higher risk for the flu, including serious problems, such as a lung infection (pneumonia), if you: Are older than 65. Are pregnant. Have a weakened disease-fighting system (immune system). This includes people who have HIV or AIDS, are on chemotherapy, or are taking medicines that reduce (suppress) the immune system. Have a long-term (chronic) illness, such as heart disease, kidney disease, diabetes, or lung disease. Have a liver disorder. Are severely overweight (morbidly obese). Have anemia. Have asthma. What are the signs or symptoms? Symptoms of this condition usually begin suddenly and last 4-14 days. These may include: Fever and chills. Headaches, body aches, or muscle aches. Sore throat. Cough. Runny or stuffy (congested) nose. Chest discomfort. Poor appetite. Weakness or fatigue. Dizziness. Nausea or vomiting. How is this diagnosed? This condition may be diagnosed based on: Your symptoms and medical history. A physical exam. Swabbing  your nose or throat and testing the fluid for the influenza virus. How is this treated? If the flu is diagnosed early, you can be treated with antiviral medicine that is given by mouth (orally) or through an IV. This can help reduce how severe the illness is and how long it lasts. Taking care of yourself at home can help relieve symptoms. Your health care provider may recommend: Taking over-the-counter medicines. Drinking plenty of fluids. In many cases, the flu goes away on its own. If you have severe symptoms or complications, you may be treated in a hospital. Follow these instructions at home: Activity Rest as needed and get plenty of sleep. Stay home from work or school as told by your health care provider. Unless you are visiting your health care provider, avoid leaving home until your fever has been gone for 24 hours without taking medicine. Eating and drinking Take an oral rehydration solution (ORS). This is a drink that is sold at pharmacies and retail stores. Drink enough fluid to keep your urine pale yellow. Drink clear fluids in small amounts as you are able. Clear fluids include water, ice chips, fruit juice mixed with water, and low-calorie sports drinks. Eat bland, easy-to-digest foods in small amounts as you are able. These foods include bananas, applesauce, rice, lean meats, toast, and crackers. Avoid drinking fluids that contain a lot of sugar or caffeine, such as energy drinks, regular sports drinks, and soda. Avoid alcohol. Avoid spicy or fatty foods. General instructions     Take over-the-counter and prescription medicines only as told by your health care provider. Use a cool mist humidifier to add humidity to the air in your home. This can  make it easier to breathe. When using a cool mist humidifier, clean it daily. Empty the water and replace it with clean water. Cover your mouth and nose when you cough or sneeze. Wash your hands with soap and water often and for at  least 20 seconds, especially after you cough or sneeze. If soap and water are not available, use alcohol-based hand sanitizer. Keep all follow-up visits. This is important. How is this prevented?  Get an annual flu shot. This is usually available in late summer, fall, or winter. Ask your health care provider when you should get your flu shot. Avoid contact with people who are sick during cold and flu season. This is generally fall and winter. Contact a health care provider if: You develop new symptoms. You have: Chest pain. Diarrhea. A fever. Your cough gets worse. You produce more mucus. You feel nauseous or you vomit. Get help right away if you: Develop shortness of breath or have difficulty breathing. Have skin or nails that turn a bluish color. Have severe pain or stiffness in your neck. Develop a sudden headache or sudden pain in your face or ear. Cannot eat or drink without vomiting. These symptoms may represent a serious problem that is an emergency. Do not wait to see if the symptoms will go away. Get medical help right away. Call your local emergency services (911 in the [place] Do not drive yourself to the hospital. Summary Influenza, also called "the flu," is a viral infection that primarily affects your respiratory tract. Symptoms of the flu usually begin suddenly and last 4-14 days. Getting an annual flu shot is the best way to prevent getting the flu. Stay home from work or school as told by your health care provider. Unless you are visiting your health care provider, avoid leaving home until your fever has been gone for 24 hours without taking medicine. Keep all follow-up visits. This is important. This information is not intended to replace advice given to you by your health care provider. Make sure you discuss any questions you have with your health care provider. Document Revised: 10/01/2019 Document Reviewed: 10/01/2019 Elsevier Patient Education  [place].   Please schedule a Follow-up Appointment to: Return if symptoms worsen or fail to improve.  If you have any other questions or concerns, please feel free to call the office or send a message through [place]. You may also schedule an earlier appointment if necessary.  Additionally, you may be receiving a survey about your experience at our office within a few days to 1 week by e-mail or mail. We value your feedback.  Nobie Putnam, DO [place]

## 2022-04-11 ENCOUNTER — Other Ambulatory Visit: Payer: Self-pay | Admitting: Obstetrics & Gynecology

## 2022-04-11 DIAGNOSIS — Z1231 Encounter for screening mammogram for malignant neoplasm of breast: Secondary | ICD-10-CM

## 2022-05-28 ENCOUNTER — Ambulatory Visit
Admission: RE | Admit: 2022-05-28 | Discharge: 2022-05-28 | Disposition: A | Discharge status: Home / Self Care | Payer: BC Managed Care – PPO | Source: Ambulatory Visit | Attending: Internal Medicine | Admitting: Internal Medicine

## 2022-05-28 ENCOUNTER — Ambulatory Visit: Payer: BC Managed Care – PPO

## 2022-05-28 DIAGNOSIS — Z1231 Encounter for screening mammogram for malignant neoplasm of breast: Secondary | ICD-10-CM

## 2022-06-13 ENCOUNTER — Ambulatory Visit (INDEPENDENT_AMBULATORY_CARE_PROVIDER_SITE_OTHER): Payer: BC Managed Care – PPO | Admitting: Internal Medicine

## 2022-06-13 ENCOUNTER — Encounter: Payer: Self-pay | Admitting: Internal Medicine

## 2022-06-13 VITALS — BP 100/62 | HR 65 | Ht 64.0 in | Wt 122.6 lb

## 2022-06-13 DIAGNOSIS — Z0000 Encounter for general adult medical examination without abnormal findings: Secondary | ICD-10-CM | POA: Diagnosis not present

## 2022-06-13 DIAGNOSIS — Z8349 Family history of other endocrine, nutritional and metabolic diseases: Secondary | ICD-10-CM | POA: Diagnosis not present

## 2022-06-13 DIAGNOSIS — Z808 Family history of malignant neoplasm of other organs or systems: Secondary | ICD-10-CM | POA: Diagnosis not present

## 2022-06-13 NOTE — Patient Instructions (Signed)

## 2022-06-13 NOTE — Progress Notes (Signed)
Subjective:    Patient ID: Christine Turner, female    DOB: Nov 28, 1975, 47 y.o.   MRN: [id number]  HPI  Patient presents to clinic today for her annual exam.  Flu: 09/2021 Tetanus: 07/2015 COVID: Pfizer x 3 Pap smear: 03/2020 Mammogram: 05/2022 Colon screening: 09/2019 Vision screening: annually Dentist: biannually  Diet: She does eat meat. She consumes fruits and veggies. She tries to avoid fried foods. She drinks mostly tea, water, soda. Exercise: [employer], cardio 3-4 days per week, strength training  Review of Systems     Past Medical History:  Diagnosis Date   Anemia    Thalassemia trait     Current Outpatient Medications  Medication Sig Dispense Refill   albuterol (PROAIR HFA) 108 (90 Base) MCG/ACT inhaler Inhale 2 puffs into the lungs every 4 (four) hours as needed for wheezing or shortness of breath. 8 g 0   Coenzyme Q10 (COQ10) 200 MG CAPS Take 2 capsules by mouth.     diphenhydrAMINE (BENADRYL) 25 mg capsule Take 25 mg by mouth every 6 (six) hours as needed.     EPINEPHRINE 0.3 mg/0.3 mL IJ SOAJ injection INJECT 0.3 ML INTO THE MUSCLE AS NEEDED FOR ANAPHYLAXIS 2 each 0   hydrOXYzine (ATARAX) 25 MG tablet TAKE 1 TABLET BY MOUTH TWICE A DAY AS NEEDED 180 tablet 1   lidocaine (XYLOCAINE) 2 % solution Use as directed 15 mLs in the mouth or throat every 3 (three) hours as needed for mouth pain (Sore throat). 300 mL 0   NON FORMULARY CBD Gummies     omeprazole (PRILOSEC) 20 MG capsule Take 1 capsule (20 mg total) by mouth daily. 14 capsule 0   oseltamivir (TAMIFLU) 75 MG capsule Take 1 capsule (75 mg total) by mouth 2 (two) times daily. For 5 days 10 capsule 0   Prenatal Vit-Fe Fumarate-FA (PRENATAL MULTIVITAMIN) TABS tablet Take 1 tablet by mouth daily at 12 noon.     traZODone (DESYREL) 50 MG tablet TAKE 1/2-1 TABLET BY MOUTH AT BEDTIME AS NEEDED FOR SLEEP. 90 tablet 1   tretinoin (RETIN-A) 0.025 % cream APPLY TO AFFECTED AREA EVERY DAY AT BEDTIME 45 g 0   No current  facility-administered medications for this visit.    Allergies  Allergen Reactions   Almond (Diagnostic) Anaphylaxis and Swelling   Fish Allergy Anaphylaxis   Penicillins Hives and Swelling    Has patient had a PCN reaction causing immediate rash, facial/tongue/throat swelling, SOB or lightheadedness with hypotension: Unknown Has patient had a PCN reaction causing severe rash involving mucus membranes or skin necrosis: No Has patient had a PCN reaction that required hospitalization No Has patient had a PCN reaction occurring within the last 10 years: No If all of the above answers are "NO", then may proceed with Cephalosporin use.    Carrot [Daucus Carota] Itching and Swelling    Swelling and itching is of the throat.    Family History  Problem Relation Age of Onset   Hypertension Mother    Diabetes Father    Hypertension Father    Colon cancer Neg Hx    Colon polyps Neg Hx    Esophageal cancer Neg Hx    Rectal cancer Neg Hx    Stomach cancer Neg Hx    Breast cancer Neg Hx     Social History   Socioeconomic History   Marital status: Married    Spouse name: Not on file   Number of children: Not on file   Years  of education: Not on file   Highest education level: Not on file  Occupational History   Not on file  Tobacco Use   Smoking status: Never   Smokeless tobacco: Never  Vaping Use   Vaping Use: Never used  Substance and Sexual Activity   Alcohol use: Yes    Alcohol/week: 2.0 standard drinks of alcohol    Types: 2 Glasses of wine per week   Drug use: Yes    Comment: canibis gummies daily per pt   Sexual activity: Yes    Birth control/protection: I.U.D.  Other Topics Concern   Not on file  Social History Narrative   Not on file   Social Determinants of Health   Financial Resource Strain: Not on file  Food Insecurity: No Food Insecurity (04/20/2020)   Hunger Vital Sign    Worried About Running Out of Food in the Last Year: Never true    Ran Out of Food  in the Last Year: Never true  Transportation Needs: No Transportation Needs (04/20/2020)   PRAPARE - Administrator, Civil Service (Medical): No    Lack of Transportation (Non-Medical): No  Physical Activity: Not on file  Stress: Not on file  Social Connections: Not on file  Intimate Partner Violence: Not on file     Constitutional: Denies fever, malaise, fatigue, headache or abrupt weight changes.  HEENT: Denies eye pain, eye redness, ear pain, ringing in the ears, wax buildup, runny nose, nasal congestion, bloody nose, or sore throat. Respiratory: Denies difficulty breathing, shortness of breath, cough or sputum production.   Cardiovascular: Denies chest pain, chest tightness, palpitations or swelling in the hands or feet.  Gastrointestinal: Denies abdominal pain, bloating, constipation, diarrhea or blood in the stool.  GU: Denies urgency, frequency, pain with urination, burning sensation, blood in urine, odor or discharge. Musculoskeletal: Denies decrease in range of motion, difficulty with gait, muscle pain or joint pain and swelling.  Skin: Denies redness, rashes, lesions or ulcercations.  Neurological: Patient reports insomnia.  Denies dizziness, difficulty with memory, difficulty with speech or problems with balance and coordination.  Psych: Patient has a history of anxiety and depression.  Denies SI/HI.  No other specific complaints in a complete review of systems (except as listed in HPI above).  Objective:   Physical Exam  BP 100/62 (BP Location: Left Arm, Patient Position: Sitting)   Pulse 65   Ht  (1.626 m)   Wt 122 lb 9.6 oz (55.6 kg)   LMP 05/30/2022 (Exact Date)   SpO2 97%   BMI 21.04 kg/m   Wt Readings from Last 3 Encounters:  03/01/22 128 lb (58.1 kg)  10/18/21 128 lb (58.1 kg)  08/30/21 127 lb (57.6 kg)    General: Appears her stated age, well developed, well nourished in NAD. Skin: Warm, dry and intact.  HEENT: Head: normal shape and  size; Eyes: sclera white, no icterus, conjunctiva pink, PERRLA and EOMs intact;  Neck:  Neck supple, trachea midline. No masses, lumps or thyromegaly present.  Cardiovascular: Normal rate and rhythm. S1,S2 noted.  No murmur, rubs or gallops noted. No JVD or BLE edema.  Pulmonary/Chest: Normal effort and positive vesicular breath sounds. No respiratory distress. No wheezes, rales or ronchi noted.  Abdomen: Normal bowel sounds. Musculoskeletal: Strength 5/5 BUE/BLE. No difficulty with gait.  Neurological: Alert and oriented. Cranial nerves II-XII grossly intact. Coordination normal.  Psychiatric: Mood and affect normal. Behavior is normal. Judgment and thought content normal.    BMET  Component Value Date/Time   NA 137 01/30/2021 1146   K 3.8 01/30/2021 1146   CL 101 01/30/2021 1146   CO2 23 01/30/2021 1146   GLUCOSE 83 01/30/2021 1146   GLUCOSE 80 08/10/2019 1223   BUN 9 01/30/2021 1146   CREATININE 0.75 01/30/2021 1146   CALCIUM 9.1 01/30/2021 1146   GFRNONAA 83 04/08/2018 1018   GFRAA 96 04/08/2018 1018    Lipid Panel     Component Value Date/Time   CHOL 99 (L) 01/30/2021 1146   TRIG 79 01/30/2021 1146   HDL 36 (L) 01/30/2021 1146   CHOLHDL 2.8 01/30/2021 1146   CHOLHDL 3 08/10/2019 1223   VLDL 11.4 08/10/2019 1223   LDLCALC 47 01/30/2021 1146    CBC    Component Value Date/Time   WBC 7.2 01/30/2021 1147   WBC 6.9 08/10/2019 1223   RBC 4.58 01/30/2021 1147   RBC 4.29 08/10/2019 1223   HGB 10.1 (L) 01/30/2021 1147   HCT 31.9 (L) 01/30/2021 1147   PLT 254 01/30/2021 1147   MCV 70 (L) 01/30/2021 1147   MCH 22.1 (L) 01/30/2021 1147   MCH 25.0 (L) 11/07/2015 1525   MCHC 31.7 01/30/2021 1147   MCHC 32.8 08/10/2019 1223   RDW 22.7 (H) 01/30/2021 1147    Hgb A1C Lab Results  Component Value Date   HGBA1C <4.2 (L) 01/30/2021            Assessment & Plan:   Preventative Health Maintenance:  Encouraged her to get a flu shot in the fall Tetanus  UTD Encouraged her to get her COVID booster Pap smear UTD Mammogram UTD Encouraged her to consume a balanced diet and exercise regimen Advised her seeing eye doctor and dentist annually We will check CBC, c-Met, TSH, lipid profile today  RTC in 6 months, follow-up chronic conditions Nicki Reaper, NP

## 2022-06-14 LAB — COMPLETE METABOLIC PANEL WITH GFR
AG Ratio: 1.6 (calc) (ref 1.0–2.5)
ALT: 6 U/L (ref 6–29)
AST: 12 U/L (ref 10–35)
Albumin: 4.5 g/dL (ref 3.6–5.1)
Alkaline phosphatase (APISO): 65 U/L (ref 31–125)
BUN: 13 mg/dL (ref 7–25)
CO2: 25 mmol/L (ref 20–32)
Calcium: 8.9 mg/dL (ref 8.6–10.2)
Chloride: 106 mmol/L (ref 98–110)
Creat: 0.84 mg/dL (ref 0.50–0.99)
Globulin: 2.8 g/dL (calc) (ref 1.9–3.7)
Glucose, Bld: 81 mg/dL (ref 65–99)
Potassium: 3.9 mmol/L (ref 3.5–5.3)
Sodium: 137 mmol/L (ref 135–146)
Total Bilirubin: 1.4 mg/dL — ABNORMAL HIGH (ref 0.2–1.2)
Total Protein: 7.3 g/dL (ref 6.1–8.1)
eGFR: 86 mL/min/{1.73_m2} (ref >=60)

## 2022-06-14 LAB — CBC
HCT: 31.3 % — ABNORMAL LOW (ref 35.0–45.0)
Hemoglobin: 9.7 g/dL — ABNORMAL LOW (ref 11.7–15.5)
MCH: 22.5 pg — ABNORMAL LOW (ref 27.0–33.0)
MCHC: 31 g/dL — ABNORMAL LOW (ref 32.0–36.0)
MCV: 72.6 fL — ABNORMAL LOW (ref 80.0–100.0)
Platelets: 170 10*3/uL (ref 140–400)
RBC: 4.31 10*6/uL (ref 3.80–5.10)
RDW: 23.3 % — ABNORMAL HIGH (ref 11.0–15.0)
WBC: 6.7 10*3/uL (ref 3.8–10.8)

## 2022-06-14 LAB — LIPID PANEL
Cholesterol: 79 mg/dL (ref <200)
HDL: 33 mg/dL — ABNORMAL LOW (ref >=50)
LDL Cholesterol (Calc): 32 mg/dL (calc)
Non-HDL Cholesterol (Calc): 46 mg/dL (calc) (ref <130)
Total CHOL/HDL Ratio: 2.4 (calc) (ref <5.0)
Triglycerides: 63 mg/dL (ref <150)

## 2022-06-14 LAB — TSH: TSH: 1.27 mIU/L

## 2022-07-16 ENCOUNTER — Ambulatory Visit: Payer: BC Managed Care – PPO | Admitting: Obstetrics and Gynecology

## 2022-07-16 ENCOUNTER — Encounter: Payer: Self-pay | Admitting: Obstetrics and Gynecology

## 2022-07-16 ENCOUNTER — Ambulatory Visit (INDEPENDENT_AMBULATORY_CARE_PROVIDER_SITE_OTHER): Payer: BC Managed Care – PPO | Admitting: Obstetrics and Gynecology

## 2022-07-16 VITALS — BP 101/65 | HR 89 | Wt 125.0 lb

## 2022-07-16 DIAGNOSIS — Z01419 Encounter for gynecological examination (general) (routine) without abnormal findings: Secondary | ICD-10-CM

## 2022-07-16 NOTE — Progress Notes (Signed)
Pelvic exam ?   Does not need a pap smear

## 2022-07-16 NOTE — Progress Notes (Signed)
Subjective:     Christine Turner is a 47 y.o. female P3 with BMI 21 here for a routine exam.  Current complaints: none.  Patient reports a monthly period without dysmenorrhea. She is sexually active using ovulation tracker. She denies pelvic pain or abnormal discharge.    Gynecologic History No LMP recorded. Contraception: rhythm method Last Pap: 03/2020. Results were: normal Last mammogram: 4/24. Results were: normal  Obstetric History OB History  Gravida Para Term Preterm AB Living  5 2 2   3 3   SAB IAB Ectopic Multiple Live Births  2 1   1 3     # Outcome Date GA Lbr Len/2nd Weight Sex Delivery Anes PTL Lv  5 Term 11/07/15 [redacted]w[redacted]d 13:06 / 00:46 6 lb 14 oz (3.118 kg) M VBAC EPI  LIV  4 SAB 08/2014 [redacted]w[redacted]d         3A Term 08/30/13 [redacted]w[redacted]d  4 lb 5 oz (1.956 kg) M CS-LTranv   LIV     Complications: Chorioamnionitis  3B Term 08/30/13 [redacted]w[redacted]d  4 lb 11 oz (2.126 kg) M CS-LTranv   LIV     Complications: Chorioamnionitis  2 SAB 09/2012 [redacted]w[redacted]d         1 IAB 1991 [redacted]w[redacted]d          Past Medical History:  Diagnosis Date   Anemia    Thalassemia trait    Past Surgical History:  Procedure Laterality Date   BREAST BIOPSY     CESAREAN SECTION     COLONOSCOPY  at age 65   normal exam per pt   diastasis repair     DILATION AND CURETTAGE OF UTERUS     ENDOMETRIAL BIOPSY  08/30/2021   IUD REMOVAL  08/30/2021   Family History  Problem Relation Age of Onset   Hypertension Mother    Diabetes Father    Hypertension Father    Colon cancer Neg Hx    Colon polyps Neg Hx    Esophageal cancer Neg Hx    Rectal cancer Neg Hx    Stomach cancer Neg Hx    Breast cancer Neg Hx    Social History   Tobacco Use   Smoking status: Never   Smokeless tobacco: Never  Vaping Use   Vaping Use: Never used  Substance Use Topics   Alcohol use: Yes    Alcohol/week: 2.0 standard drinks of alcohol    Types: 2 Glasses of wine per week   Drug use: Yes    Comment: canibis gummies daily per pt       Review of  Systems Pertinent items noted in HPI and remainder of comprehensive ROS otherwise negative.    Objective:  Blood pressure 101/65, pulse 89, weight 125 lb (56.7 kg).   GENERAL: Well-developed, well-nourished female in no acute distress.  HEENT: Normocephalic, atraumatic. Sclerae anicteric.  NECK: Supple. Normal thyroid.  LUNGS: Clear to auscultation bilaterally.  HEART: Regular rate and rhythm. BREASTS: Symmetric in size. No palpable masses or lymphadenopathy, skin changes, or nipple drainage. ABDOMEN: Soft, nontender, nondistended. No organomegaly. PELVIC: Normal external female genitalia. Vagina is pink and rugated.  Normal discharge. Normal appearing cervix. Uterus is normal in size. No adnexal mass or tenderness. Chaperone present during the pelvic exam EXTREMITIES: No cyanosis, clubbing, or edema, 2+ distal pulses.     Assessment:    Healthy female exam.    Plan:    Pap smear deferred this year per patient request Patient with normal screening mammogram 4/24 Patient is  current on colonoscopy Patient will be contacted with abnormal results

## 2022-11-13 IMAGING — MG MM DIGITAL SCREENING BILAT W/ TOMO AND CAD
8 series · 9 of 24 positions shown · non-contrast
Comparison: Previous exam(s).

CLINICAL DATA: Screening.

EXAM:
DIGITAL SCREENING BILATERAL MAMMOGRAM WITH TOMOSYNTHESIS AND CAD
TECHNIQUE: Bilateral screening digital craniocaudal and mediolateral oblique
mammograms were obtained. Bilateral screening digital breast
tomosynthesis was performed. The images were evaluated with
computer-aided detection.

[R MLO synth-2D]
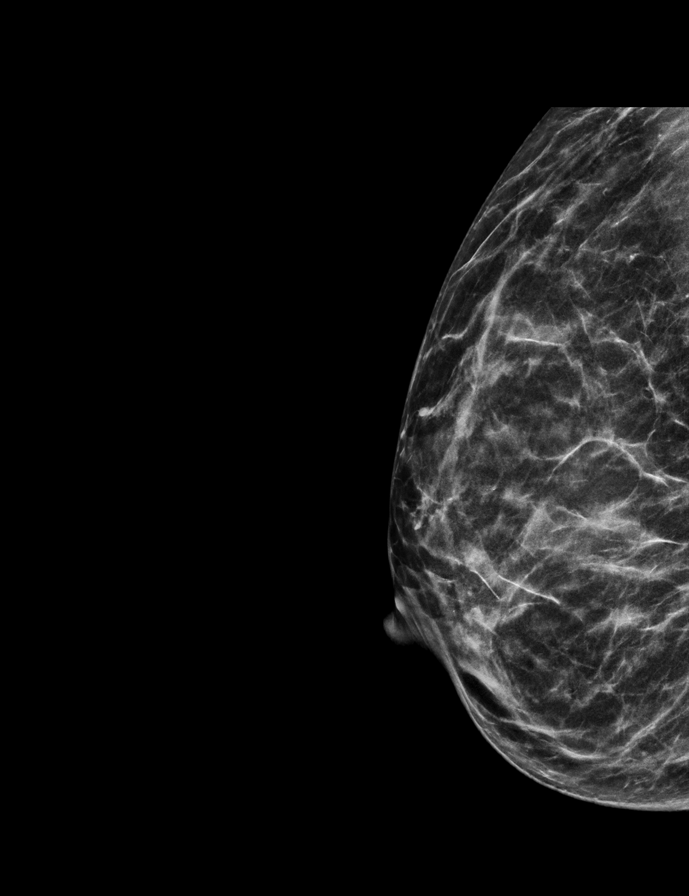

[L CC synth-2D]
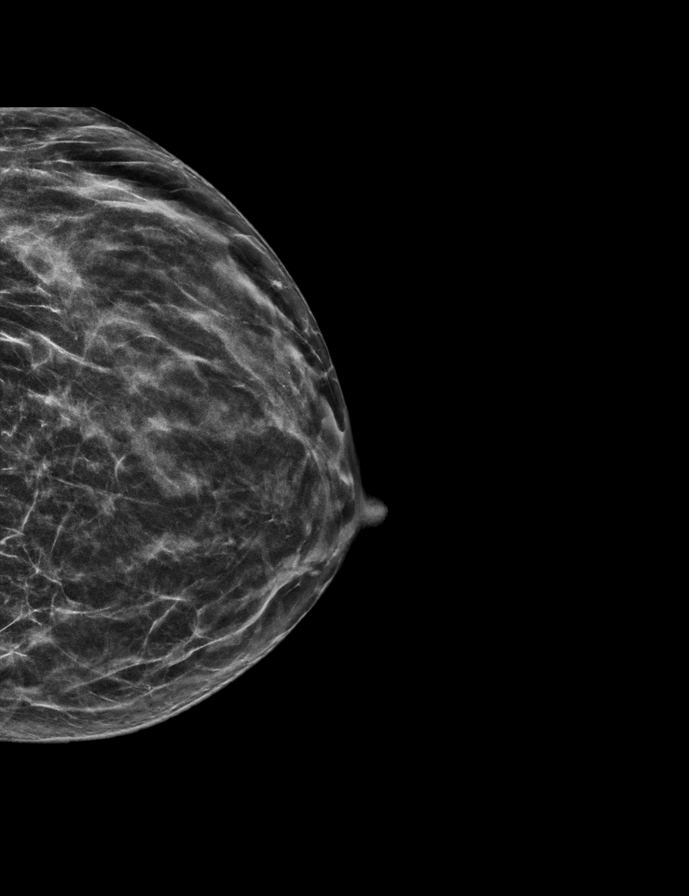

[L MLO synth-2D]
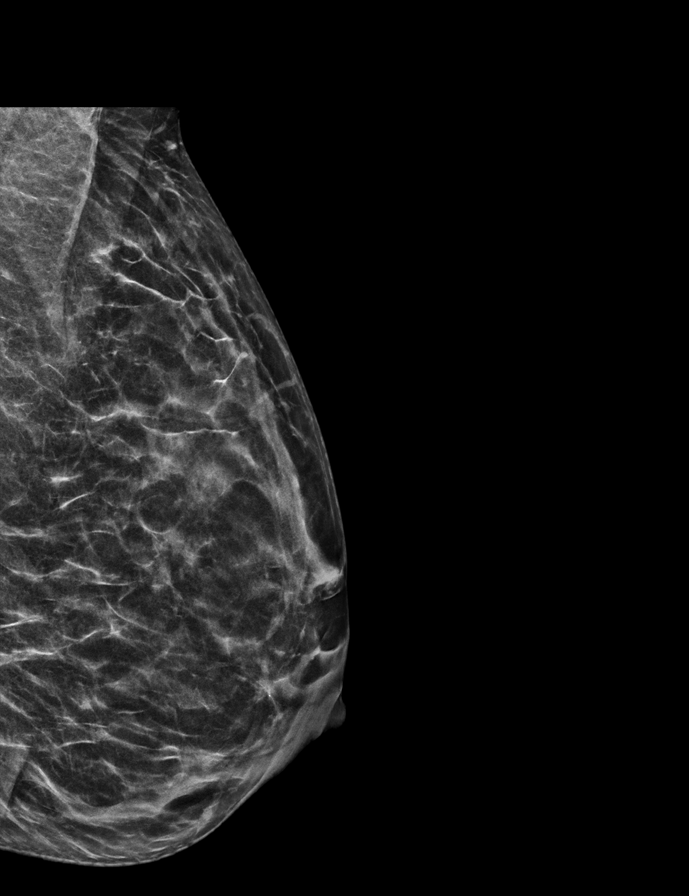

[R CC synth-2D]
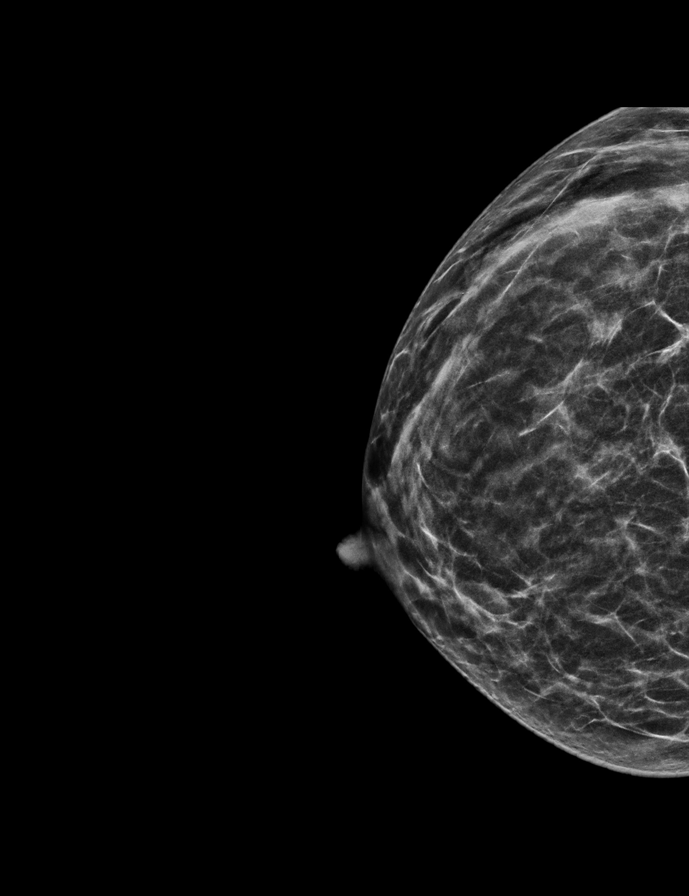

[R CC tomo · 2 of 48 frames shown]
[frame 16/48]
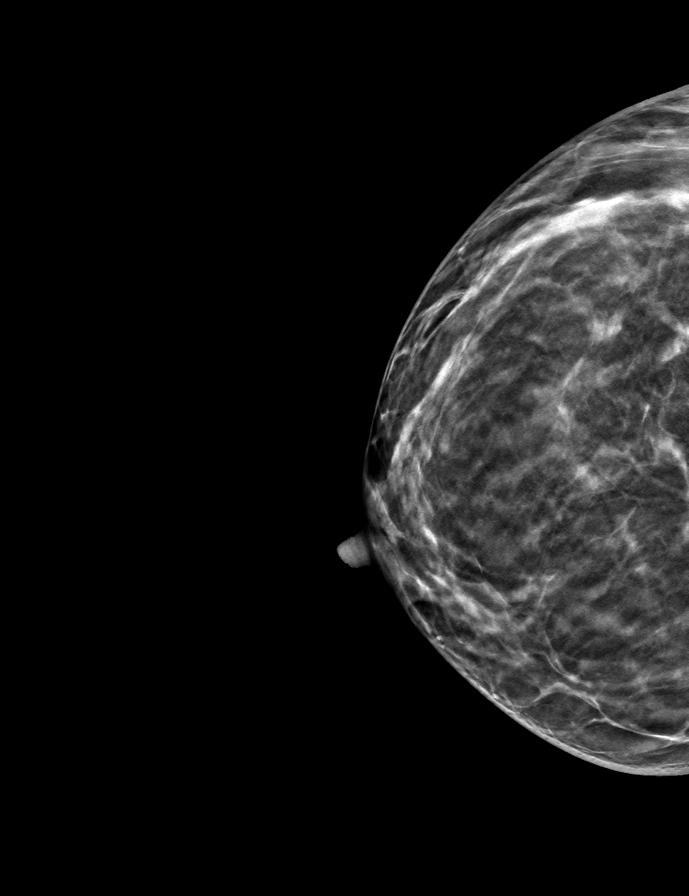
[frame 25/48]
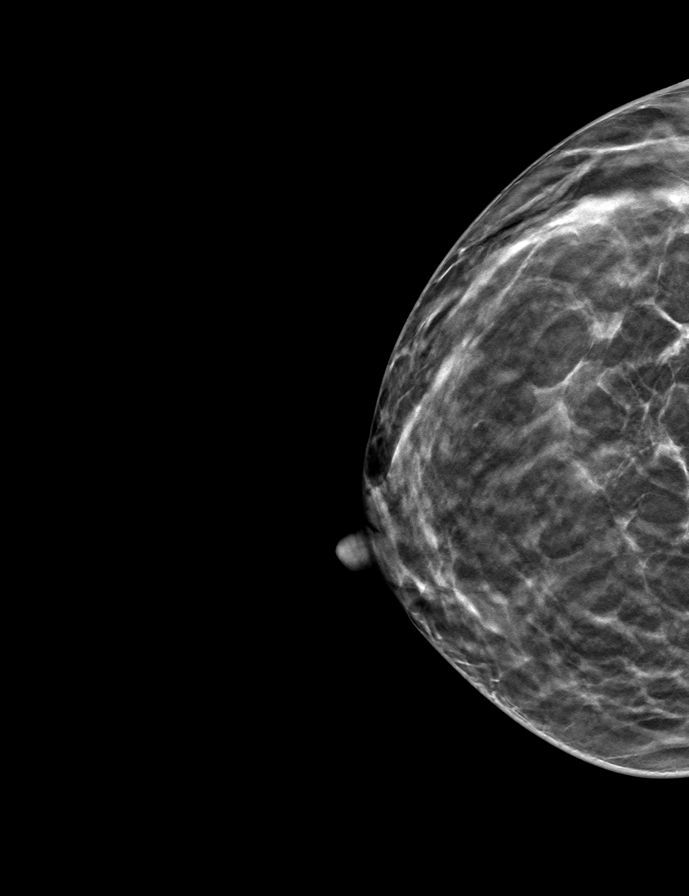

[L CC tomo · tomo slice 24/47.0]
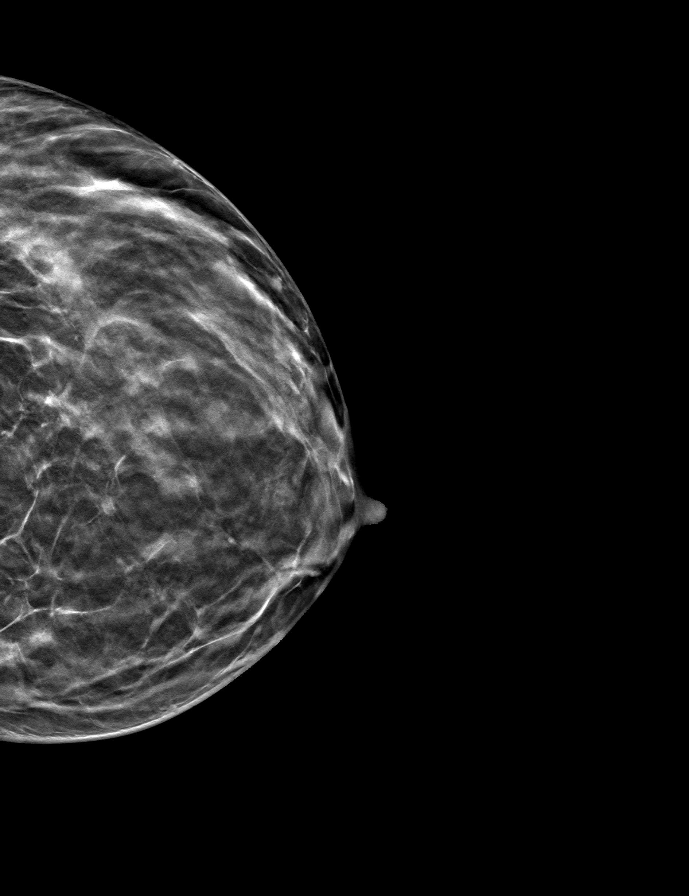

[R MLO tomo · tomo slice 23/46.0]
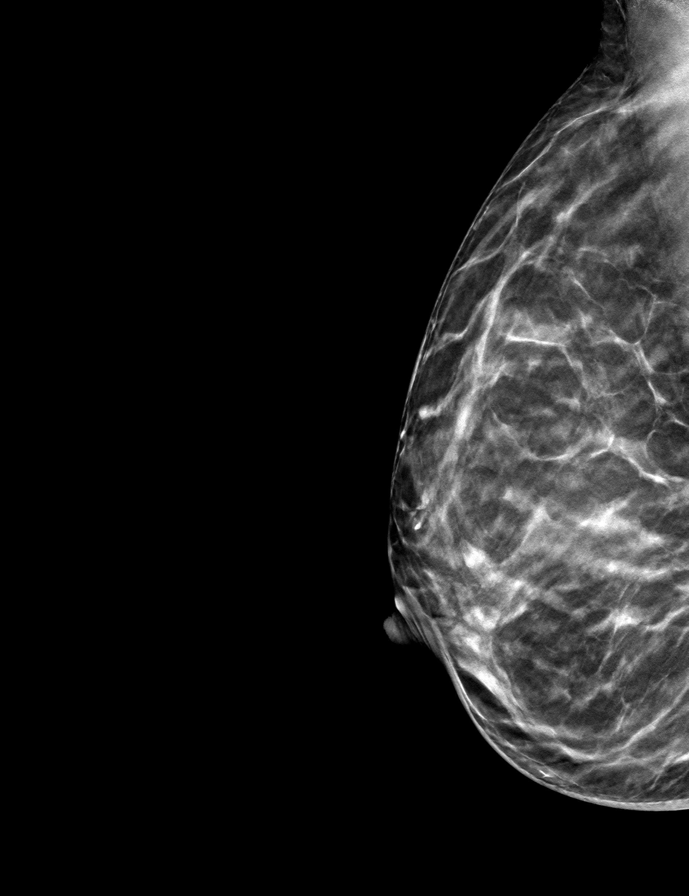

[L MLO tomo · tomo slice 25/48.0]
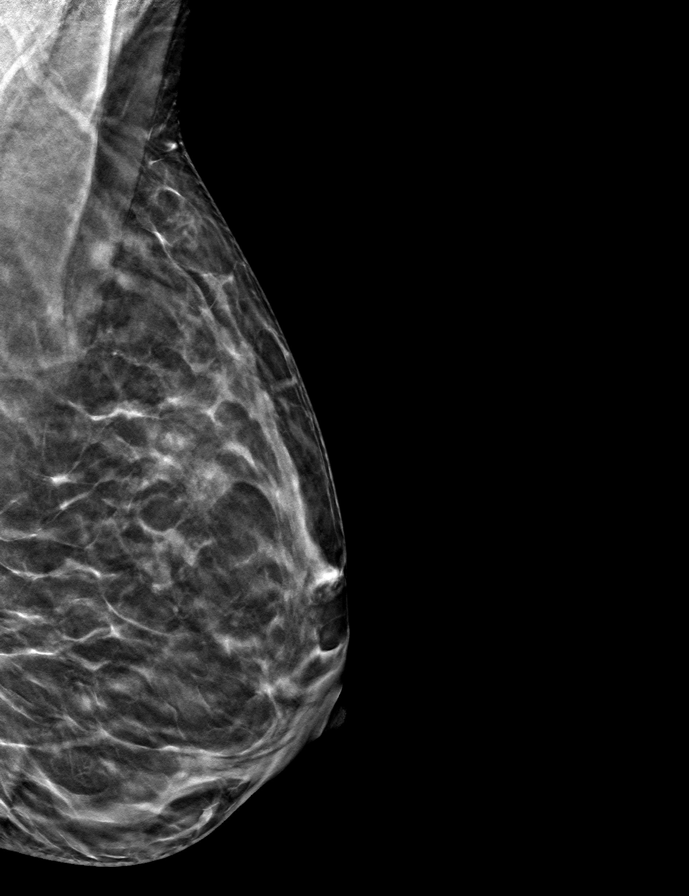

[9 of 24 positions shown; findings below may reference images not displayed]

ACR Breast Density Category c: The breast tissue is heterogeneously
dense, which may obscure small masses.
FINDINGS: There are no findings suspicious for malignancy.
IMPRESSION: No mammographic evidence of malignancy. A result letter of this
screening mammogram will be mailed directly to the patient.

RECOMMENDATION:
Screening mammogram in one year. (Code:Q3-W-BC3)

BI-RADS CATEGORY  1: Negative.

## 2022-11-27 ENCOUNTER — Other Ambulatory Visit: Payer: Self-pay | Admitting: Internal Medicine

## 2022-11-28 ENCOUNTER — Other Ambulatory Visit: Payer: Self-pay | Admitting: Internal Medicine

## 2022-11-28 MED ORDER — HYDROXYZINE HCL 25 MG PO TABS: 25.0000 mg | ORAL_TABLET | Freq: Two times a day (BID) | ORAL | 1 refills | Status: DC | PRN

## 2022-11-28 NOTE — Telephone Encounter (Signed)
Requested medication (s) are due for refill today: routing for review  Requested medication (s) are on the active medication list: yes  Last refill:  11/01/21  Future visit scheduled: yes  Notes to clinic:  Patient not taking: Reported on 07/16/2022, medication still on list. Routing for approval.     Requested Prescriptions  Pending Prescriptions Disp Refills   EPINEPHRINE 0.3 mg/0.3 mL IJ SOAJ injection [Pharmacy Med Name: EPINEPHRINE 0.3 MG AUTO-INJECT] 2 each 0    Sig: INJECT 0.3 ML INTO THE MUSCLE AS NEEDED FOR ANAPHYLAXIS     Immunology: Antidotes Passed - 11/27/2022 11:15 AM      Passed - Valid encounter within last 12 months    Recent Outpatient Visits           5 months ago Encounter for general adult medical examination w/o abnormal findings   [place] [facility] [place], Salvadore Oxford, NP   9 months ago Influenza A   [place] [facility] Smitty Cords, DO   1 year ago Need for influenza vaccination   [place] [facility] [place], Salvadore Oxford, NP   1 year ago Anxiety and depression   [place] [facility] [place], Salvadore Oxford, NP   1 year ago Bloating   [place] [facility] [place], Salvadore Oxford, NP       Future Appointments             In 2 weeks Sampson Si, Salvadore Oxford, NP [facility]             hydrOXYzine (ATARAX) 25 MG tablet [Pharmacy Med Name: HYDROXYZINE HCL 25 MG TABLET] 180 tablet 1    Sig: TAKE 1 TABLET BY MOUTH TWICE A DAY AS NEEDED     Ear, Nose, and Throat:  Antihistamines 2 Passed - 11/27/2022 11:15 AM      Passed - Cr in normal range and within 360 days    Creat  Date Value Ref Range Status  06/13/2022 0.84 0.50 - 0.99 mg/dL Final         Passed - Valid encounter within last 12 months    Recent Outpatient Visits           5 months ago Encounter for general adult medical examination w/o abnormal findings   [place] [facility] [place], Salvadore Oxford, NP   9 months ago Influenza A   [place] [facility] Smitty Cords, DO   1 year ago Need for influenza vaccination   [place] [facility] [place], Salvadore Oxford, NP   1 year ago Anxiety and depression   [facility] [place], Salvadore Oxford, NP   1 year ago Bloating   [place] [facility] [place], Salvadore Oxford, NP       Future Appointments             In 2 weeks Sampson Si, Salvadore Oxford, NP  [facility], [facility]

## 2022-12-13 ENCOUNTER — Ambulatory Visit: Payer: BC Managed Care – PPO | Admitting: Internal Medicine

## 2022-12-13 NOTE — Progress Notes (Deleted)
Subjective:    Patient ID: Christine Turner, female    DOB: Jun 18, 1975, 47 y.o.   MRN: [id number]  HPI  Patient presents to clinic today for 31-month follow-up of chronic conditions.  Anxiety depression: Chronic, managed on hydroxyzine as needed.  She is currently seeing a therapist.  She denies SI/HI.  Insomnia: She has difficulty falling asleep.  She takes hydroxyzine as needed with some relief of symptoms.  There is no sleep study on file.  Iron deficiency anemia/beta thalassemia: Her last H/H was 9.7/31.3, 05/2022.  She is taking iron as prescribed.  She does not follow with hematology.  Review of Systems     Past Medical History:  Diagnosis Date   Anemia    Thalassemia trait     Current Outpatient Medications  Medication Sig Dispense Refill   Coenzyme Q10 (COQ10) 200 MG CAPS Take 2 capsules by mouth.     EPINEPHRINE 0.3 mg/0.3 mL IJ SOAJ injection INJECT 0.3 ML INTO THE MUSCLE AS NEEDED FOR ANAPHYLAXIS 2 each 0   hydrOXYzine (ATARAX) 25 MG tablet Take 1 tablet (25 mg total) by mouth 2 (two) times daily as needed. 180 tablet 1   NON FORMULARY CBD Gummies     Prenatal Vit-Fe Fumarate-FA (PRENATAL MULTIVITAMIN) TABS tablet Take 1 tablet by mouth daily at 12 noon.     No current facility-administered medications for this visit.    Allergies  Allergen Reactions   Almond (Diagnostic) Anaphylaxis and Swelling   Apple (Diagnostic) Anaphylaxis   Cherry Anaphylaxis   Fish Allergy Anaphylaxis   Fish-Derived Products Anaphylaxis   Peanut (Diagnostic) Anaphylaxis   Penicillins Hives and Swelling    Has patient had a PCN reaction causing immediate rash, facial/tongue/throat swelling, SOB or lightheadedness with hypotension: Unknown Has patient had a PCN reaction causing severe rash involving mucus membranes or skin necrosis: No Has patient had a PCN reaction that required hospitalization No Has patient had a PCN reaction occurring within the last 10 years: No If all of the above  answers are "NO", then may proceed with Cephalosporin use.    Carrot [Daucus Carota] Itching and Swelling    Swelling and itching is of the throat.    Family History  Problem Relation Age of Onset   Hypertension Mother    Diabetes Father    Hypertension Father    Colon cancer Neg Hx    Colon polyps Neg Hx    Esophageal cancer Neg Hx    Rectal cancer Neg Hx    Stomach cancer Neg Hx    Breast cancer Neg Hx     Social History   Socioeconomic History   Marital status: Married    Spouse name: Not on file   Number of children: Not on file   Years of education: Not on file   Highest education level: Not on file  Occupational History   Not on file  Tobacco Use   Smoking status: Never   Smokeless tobacco: Never  Vaping Use   Vaping status: Never Used  Substance and Sexual Activity   Alcohol use: Yes    Alcohol/week: 2.0 standard drinks of alcohol    Types: 2 Glasses of wine per week   Drug use: Yes    Comment: canibis gummies daily per pt   Sexual activity: Yes    Birth control/protection: I.U.D.  Other Topics Concern   Not on file  Social History Narrative   Not on file   Social Determinants of Health  Financial Resource Strain: Not on file  Food Insecurity: No Food Insecurity (04/20/2020)   Hunger Vital Sign    Worried About Running Out of Food in the Last Year: Never true    Ran Out of Food in the Last Year: Never true  Transportation Needs: No Transportation Needs (04/20/2020)   PRAPARE - Administrator, Civil Service (Medical): No    Lack of Transportation (Non-Medical): No  Physical Activity: Not on file  Stress: Not on file  Social Connections: Not on file  Intimate Partner Violence: Not on file     Constitutional: Denies fever, malaise, fatigue, headache or abrupt weight changes.  HEENT: Denies eye pain, eye redness, ear pain, ringing in the ears, wax buildup, runny nose, nasal congestion, bloody nose, or sore throat. Respiratory: Denies  difficulty breathing, shortness of breath, cough or sputum production.   Cardiovascular: Denies chest pain, chest tightness, palpitations or swelling in the hands or feet.  Gastrointestinal: Denies abdominal pain, bloating, constipation, diarrhea or blood in the stool.  GU: Denies urgency, frequency, pain with urination, burning sensation, blood in urine, odor or discharge. Musculoskeletal: Denies decrease in range of motion, difficulty with gait, muscle pain or joint pain and swelling.  Skin: Denies redness, rashes, lesions or ulcercations.  Neurological: Patient reports insomnia.  Denies dizziness, difficulty with memory, difficulty with speech or problems with balance and coordination.  Psych: Patient has a history of anxiety and depression.  Denies SI/HI.  No other specific complaints in a complete review of systems (except as listed in HPI above).  Objective:   Physical Exam   There were no vitals taken for this visit. Wt Readings from Last 3 Encounters:  07/16/22 125 lb (56.7 kg)  06/13/22 122 lb 9.6 oz (55.6 kg)  03/01/22 128 lb (58.1 kg)    General: Appears their stated age, well developed, well nourished in NAD. Skin: Warm, dry and intact. No rashes, lesions or ulcerations noted. HEENT: Head: normal shape and size; Eyes: sclera white, no icterus, conjunctiva pink, PERRLA and EOMs intact; Ears: Tm's gray and intact, normal light reflex; Nose: mucosa pink and moist, septum midline; Throat/Mouth: Teeth present, mucosa pink and moist, no exudate, lesions or ulcerations noted.  Neck:  Neck supple, trachea midline. No masses, lumps or thyromegaly present.  Cardiovascular: Normal rate and rhythm. S1,S2 noted.  No murmur, rubs or gallops noted. No JVD or BLE edema. No carotid bruits noted. Pulmonary/Chest: Normal effort and positive vesicular breath sounds. No respiratory distress. No wheezes, rales or ronchi noted.  Abdomen: Soft and nontender. Normal bowel sounds. No distention or  masses noted. Liver, spleen and kidneys non palpable. Musculoskeletal: Normal range of motion. No signs of joint swelling. No difficulty with gait.  Neurological: Alert and oriented. Cranial nerves II-XII grossly intact. Coordination normal.  Psychiatric: Mood and affect normal. Behavior is normal. Judgment and thought content normal.    BMET    Component Value Date/Time   NA 137 06/13/2022 1011   NA 137 01/30/2021 1146   K 3.9 06/13/2022 1011   CL 106 06/13/2022 1011   CO2 25 06/13/2022 1011   GLUCOSE 81 06/13/2022 1011   BUN 13 06/13/2022 1011   BUN 9 01/30/2021 1146   CREATININE 0.84 06/13/2022 1011   CALCIUM 8.9 06/13/2022 1011   GFRNONAA 83 04/08/2018 1018   GFRAA 96 04/08/2018 1018    Lipid Panel     Component Value Date/Time   CHOL 79 06/13/2022 1011   CHOL 99 (L) 01/30/2021  1146   TRIG 63 06/13/2022 1011   HDL 33 (L) 06/13/2022 1011   HDL 36 (L) 01/30/2021 1146   CHOLHDL 2.4 06/13/2022 1011   VLDL 11.4 08/10/2019 1223   LDLCALC 32 06/13/2022 1011    CBC    Component Value Date/Time   WBC 6.7 06/13/2022 1011   RBC 4.31 06/13/2022 1011   HGB 9.7 (L) 06/13/2022 1011   HGB 10.1 (L) 01/30/2021 1147   HCT 31.3 (L) 06/13/2022 1011   HCT 31.9 (L) 01/30/2021 1147   PLT 170 06/13/2022 1011   PLT 254 01/30/2021 1147   MCV 72.6 (L) 06/13/2022 1011   MCV 70 (L) 01/30/2021 1147   MCH 22.5 (L) 06/13/2022 1011   MCHC 31.0 (L) 06/13/2022 1011   RDW 23.3 (H) 06/13/2022 1011   RDW 22.7 (H) 01/30/2021 1147    Hgb A1C Lab Results  Component Value Date   HGBA1C <4.2 (L) 01/30/2021           Assessment & Plan:      RTC in 6 months, for your annual exam Nicki Reaper, NP

## 2023-02-07 IMAGING — US US PELVIS COMPLETE WITH TRANSVAGINAL
1 series · 13 of 25 positions shown · non-contrast
Comparison: 08/21/2020

CLINICAL DATA: Abnormal uterine bleeding, has IUD, irregular menses

EXAM:
TRANSABDOMINAL AND TRANSVAGINAL ULTRASOUND OF PELVIS
TECHNIQUE: Both transabdominal and transvaginal ultrasound examinations of the
pelvis were performed. Transabdominal technique was performed for
global imaging of the pelvis including uterus, ovaries, adnexal
regions, and pelvic cul-de-sac. It was necessary to proceed with
endovaginal exam following the transabdominal exam to visualize the
endometrium and ovaries.

[Series 1: us pelvic complete with transvaginal · 13 of 94 slices shown]
[im 1/94]
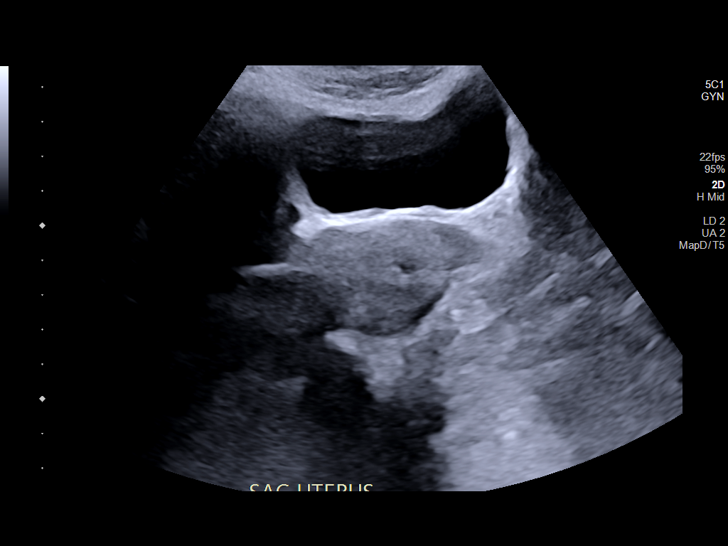
[im 8/94]
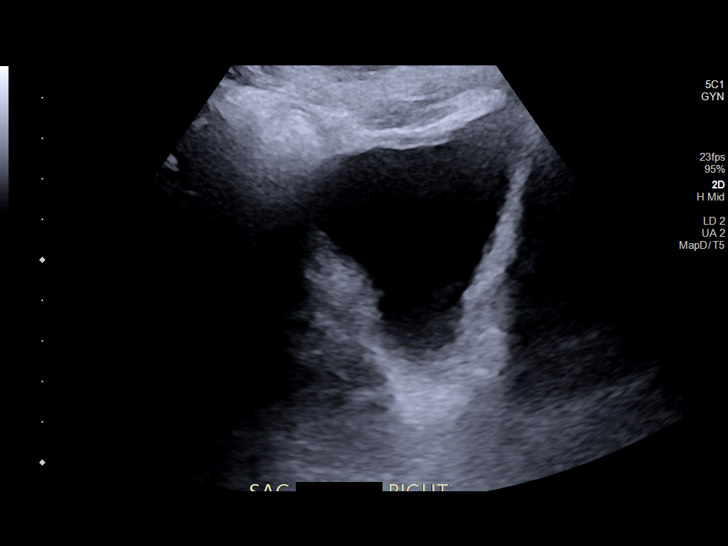
[im 16/94]
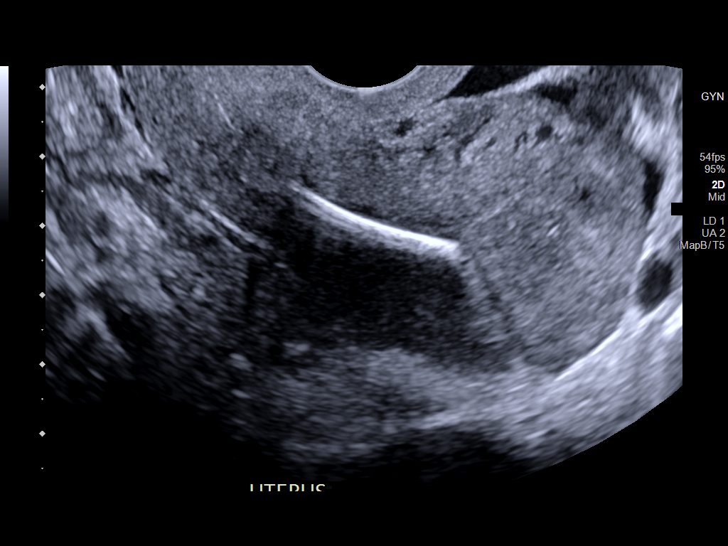
[im 24/94]
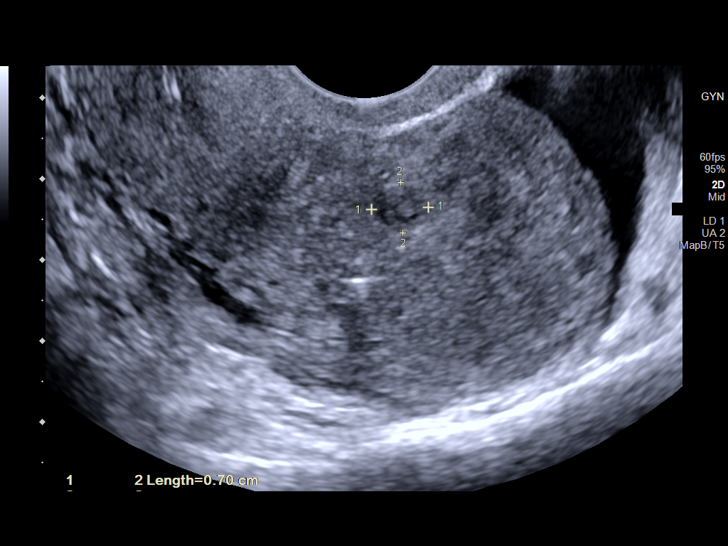
[im 32/94]
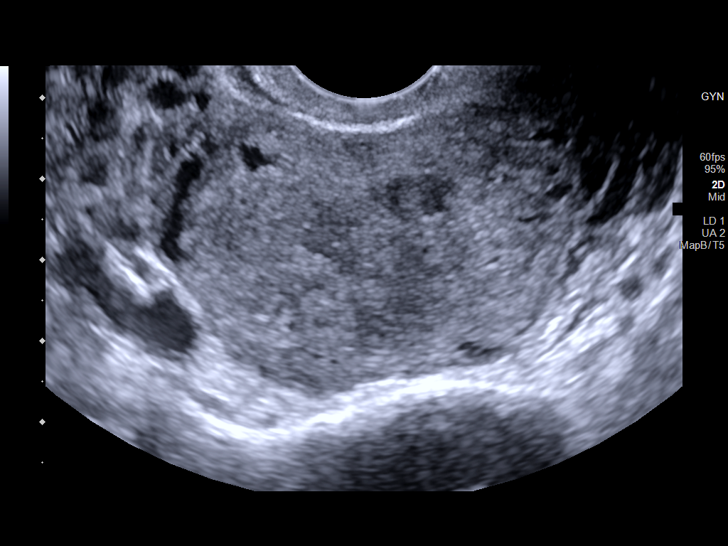
[im 39/94]
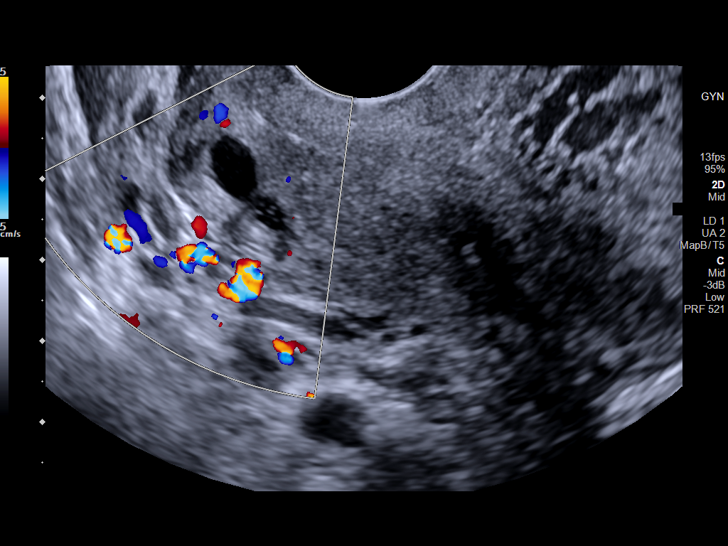
[im 47/94]
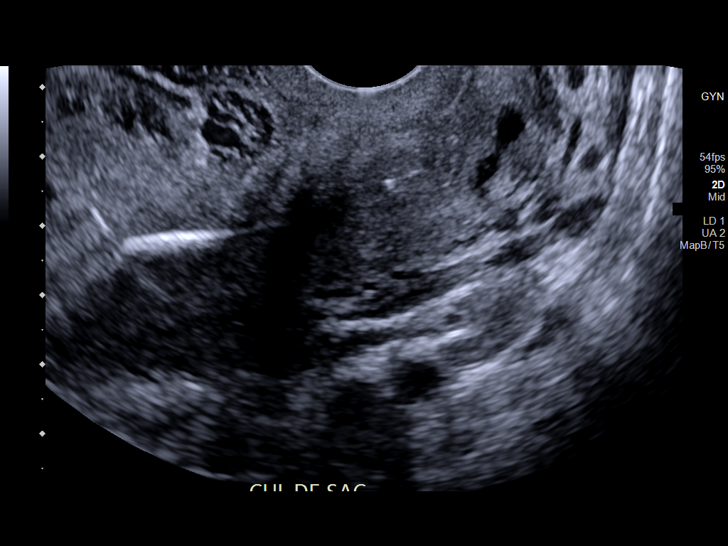
[im 55/94]
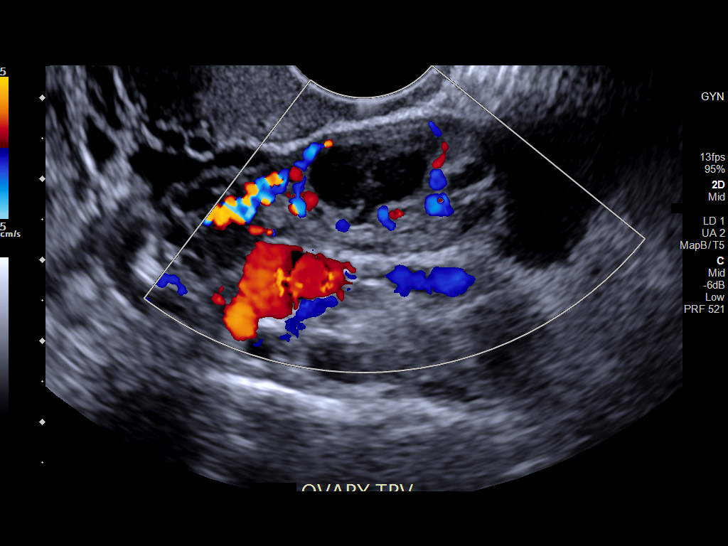
[im 63/94]
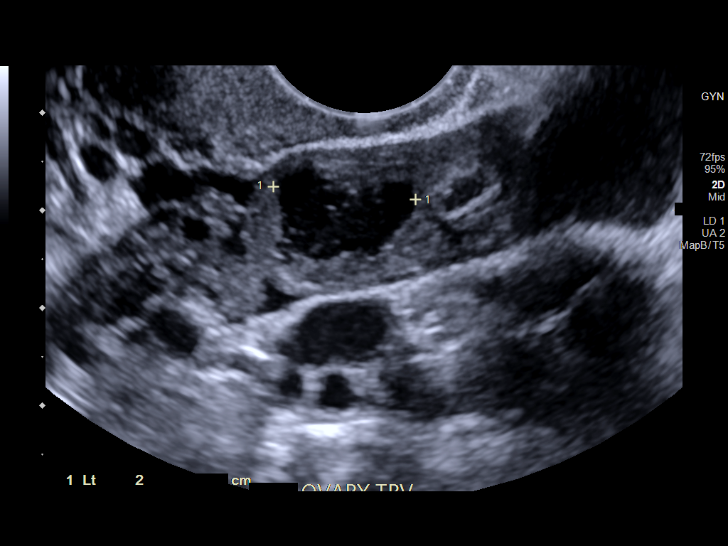
[im 70/94]
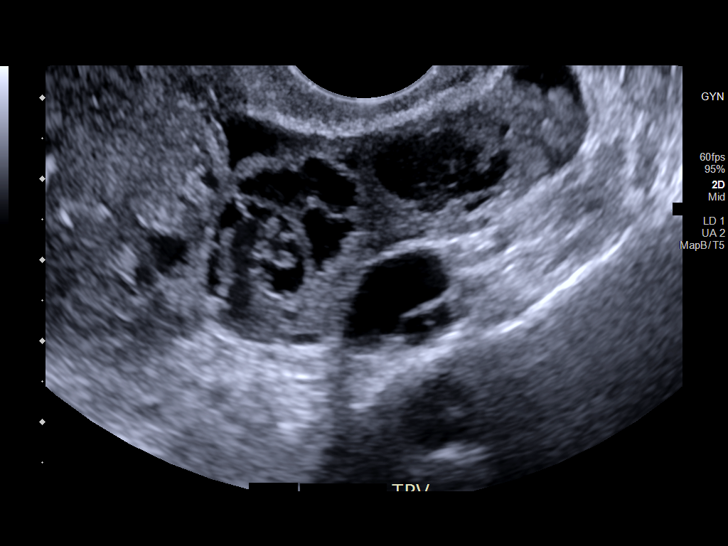
[im 78/94]
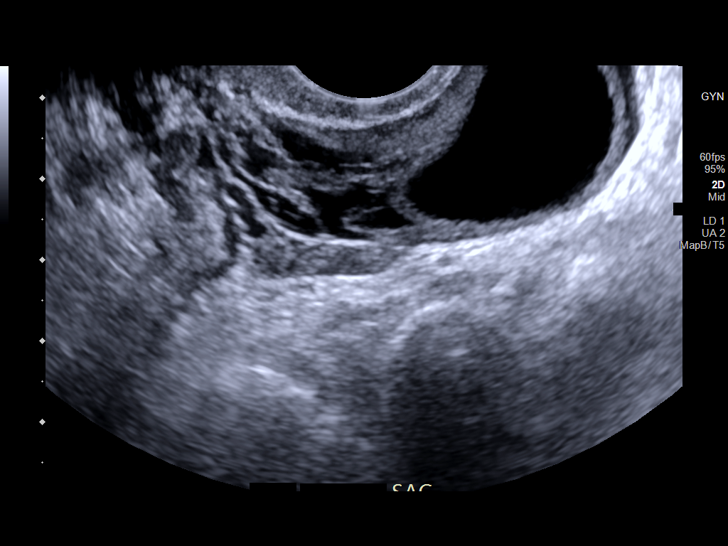
[im 86/94]
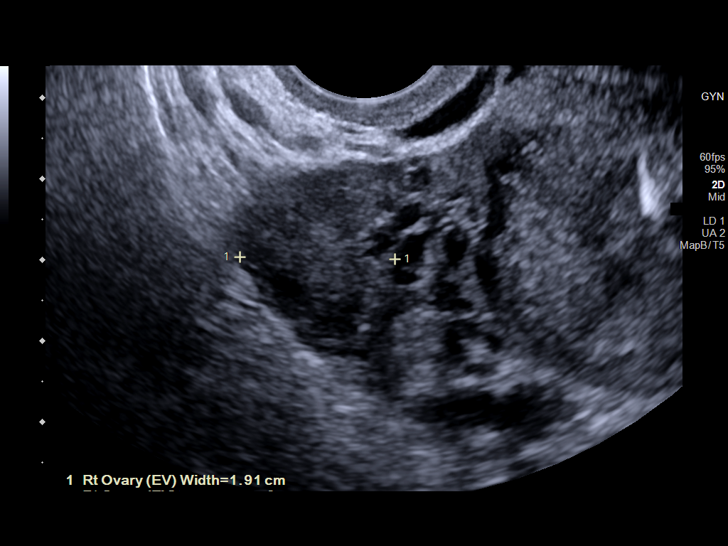
[im 94/94]
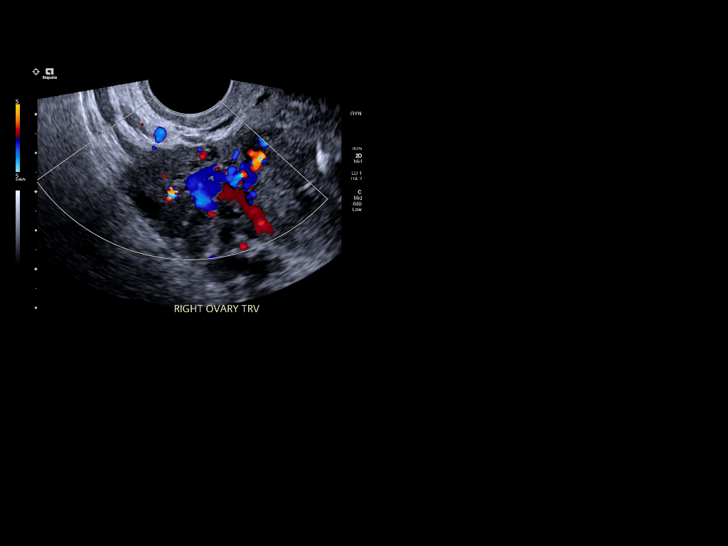

[13 of 25 positions shown; findings below may reference images not displayed]

FINDINGS: Uterus

Measurements: 8.0 x 4.1 x 4.7 cm = volume: 80 mL. Variable position
during exam. Heterogeneous myometrium. Intramural leiomyoma
posterior upper uterus 13 x 15 x 19 mm. Additional small intramural
leiomyoma posterior upper to mid uterus 8 mm diameter. No additional
masses.

Endometrium

Thickness: 3 mm. Trace endometrial fluid. Stem and RIGHT arm of IUD
are in expected position. LEFT arm of IUD questionably extends intra
myometrial at the upper to mid LEFT uterus.

Right ovary

Measurements: 2.4 x 2.3 x 1.9 cm = volume: 5.5 mL. Normal morphology
without mass

Left ovary

Measurements: 4.8 x 2.5 x 3.0 cm = volume: 19.0 mL. Simple cyst LEFT
ovary 3.1 cm diameter; no follow-up imaging recommended. Additional
small corpus luteum.

Other findings

Small amount of nonspecific free pelvic fluid. No additional adnexal
masses.
IMPRESSION: Two small intramural uterine leiomyomata as above.

Stem and RIGHT arm of IUD are in expected position with LEFT arm of
IUD questionably extending into myometrial wall.

## 2023-03-18 DIAGNOSIS — S6391XA Sprain of unspecified part of right wrist and hand, initial encounter: Secondary | ICD-10-CM | POA: Diagnosis not present

## 2023-04-23 ENCOUNTER — Encounter: Payer: Self-pay | Admitting: Internal Medicine

## 2023-04-23 ENCOUNTER — Ambulatory Visit (INDEPENDENT_AMBULATORY_CARE_PROVIDER_SITE_OTHER): Payer: Medicaid Other | Admitting: Internal Medicine

## 2023-04-23 VITALS — BP 102/60 | Ht 64.0 in | Wt 128.4 lb

## 2023-04-23 DIAGNOSIS — Z1231 Encounter for screening mammogram for malignant neoplasm of breast: Secondary | ICD-10-CM | POA: Diagnosis not present

## 2023-04-23 DIAGNOSIS — R739 Hyperglycemia, unspecified: Secondary | ICD-10-CM | POA: Diagnosis not present

## 2023-04-23 DIAGNOSIS — Z136 Encounter for screening for cardiovascular disorders: Secondary | ICD-10-CM | POA: Diagnosis not present

## 2023-04-23 DIAGNOSIS — Z0000 Encounter for general adult medical examination without abnormal findings: Secondary | ICD-10-CM | POA: Diagnosis not present

## 2023-04-23 MED ORDER — HYDROXYZINE HCL 25 MG PO TABS: 25.0000 mg | ORAL_TABLET | Freq: Two times a day (BID) | ORAL | 1 refills | Status: DC | PRN

## 2023-04-23 NOTE — Patient Instructions (Signed)

## 2023-04-23 NOTE — Progress Notes (Signed)
 Subjective:    Patient ID: Christine Turner, female    DOB: 1975/09/08, 48 y.o.   MRN: [id number]  HPI  Patient presents to clinic today for her annual exam.  Flu: 09/2021 Tetanus: 07/2015 COVID: Pfizer x 3 Pap smear: 03/2020 Mammogram: 05/2022 Colon screening: 09/2019 Vision screening: annually Dentist: biannually  Diet: She does eat meat. She consumes fruits and veggies. She tries to avoid fried foods. She drinks mostly tea, water, soda. Exercise: [employer], cardio 3-4 days per week, strength training  Review of Systems     Past Medical History:  Diagnosis Date   Anemia    Thalassemia trait     Current Outpatient Medications  Medication Sig Dispense Refill   Coenzyme Q10 (COQ10) 200 MG CAPS Take 2 capsules by mouth.     EPINEPHRINE 0.3 mg/0.3 mL IJ SOAJ injection INJECT 0.3 ML INTO THE MUSCLE AS NEEDED FOR ANAPHYLAXIS 2 each 0   hydrOXYzine (ATARAX) 25 MG tablet Take 1 tablet (25 mg total) by mouth 2 (two) times daily as needed. 180 tablet 1   NON FORMULARY CBD Gummies     Prenatal Vit-Fe Fumarate-FA (PRENATAL MULTIVITAMIN) TABS tablet Take 1 tablet by mouth daily at 12 noon.     No current facility-administered medications for this visit.    Allergies  Allergen Reactions   Almond (Diagnostic) Anaphylaxis and Swelling   Apple (Diagnostic) Anaphylaxis   Cherry Anaphylaxis   Fish Allergy Anaphylaxis   Fish-Derived Products Anaphylaxis   Peanut (Diagnostic) Anaphylaxis   Penicillins Hives and Swelling    Has patient had a PCN reaction causing immediate rash, facial/tongue/throat swelling, SOB or lightheadedness with hypotension: Unknown Has patient had a PCN reaction causing severe rash involving mucus membranes or skin necrosis: No Has patient had a PCN reaction that required hospitalization No Has patient had a PCN reaction occurring within the last 10 years: No If all of the above answers are "NO", then may proceed with Cephalosporin use.    Carrot [Daucus Carota]  Itching and Swelling    Swelling and itching is of the throat.    Family History  Problem Relation Age of Onset   Hypertension Mother    Diabetes Father    Hypertension Father    Colon cancer Neg Hx    Colon polyps Neg Hx    Esophageal cancer Neg Hx    Rectal cancer Neg Hx    Stomach cancer Neg Hx    Breast cancer Neg Hx     Social History   Socioeconomic History   Marital status: Married    Spouse name: Not on file   Number of children: Not on file   Years of education: Not on file   Highest education level: Not on file  Occupational History   Not on file  Tobacco Use   Smoking status: Never   Smokeless tobacco: Never  Vaping Use   Vaping status: Never Used  Substance and Sexual Activity   Alcohol use: Yes    Alcohol/week: 2.0 standard drinks of alcohol    Types: 2 Glasses of wine per week   Drug use: Yes    Comment: canibis gummies daily per pt   Sexual activity: Yes    Birth control/protection: I.U.D.  Other Topics Concern   Not on file  Social History Narrative   Not on file   Social Drivers of Health   Financial Resource Strain: Not on file  Food Insecurity: No Food Insecurity (04/20/2020)   Hunger Vital Sign  Worried About Programme researcher, broadcasting/film/video in the Last Year: Never true    Ran Out of Food in the Last Year: Never true  Transportation Needs: No Transportation Needs (04/20/2020)   PRAPARE - Administrator, Civil Service (Medical): No    Lack of Transportation (Non-Medical): No  Physical Activity: Not on file  Stress: Not on file  Social Connections: Not on file  Intimate Partner Violence: Not on file     Constitutional: Denies fever, malaise, fatigue, headache or abrupt weight changes.  HEENT: Denies eye pain, eye redness, ear pain, ringing in the ears, wax buildup, runny nose, nasal congestion, bloody nose, or sore throat. Respiratory: Denies difficulty breathing, shortness of breath, cough or sputum production.   Cardiovascular:  Denies chest pain, chest tightness, palpitations or swelling in the hands or feet.  Gastrointestinal: Denies abdominal pain, bloating, constipation, diarrhea or blood in the stool.  GU: Denies urgency, frequency, pain with urination, burning sensation, blood in urine, odor or discharge. Musculoskeletal: Denies decrease in range of motion, difficulty with gait, muscle pain or joint pain and swelling.  Skin: Denies redness, rashes, lesions or ulcercations.  Neurological: Patient reports insomnia.  Denies dizziness, difficulty with memory, difficulty with speech or problems with balance and coordination.  Psych: Patient has a history of anxiety and depression.  Denies SI/HI.  No other specific complaints in a complete review of systems (except as listed in HPI above).  Objective:   Physical Exam  BP 102/60 (BP Location: Left Arm, Patient Position: Sitting, Cuff Size: Normal)   Ht 5\' 4"  (1.626 m)   Wt 128 lb 6.4 oz (58.2 kg)   LMP 03/29/2023 (Exact Date)   BMI 22.04 kg/m    Wt Readings from Last 3 Encounters:  07/16/22 125 lb (56.7 kg)  06/13/22 122 lb 9.6 oz (55.6 kg)  03/01/22 128 lb (58.1 kg)    General: Appears her stated age, well developed, well nourished in NAD. Skin: Warm, dry and intact.  HEENT: Head: normal shape and size; Eyes: sclera white, no icterus, conjunctiva pink, PERRLA and EOMs intact;  Neck:  Neck supple, trachea midline. No masses, lumps or thyromegaly present.  Cardiovascular: Normal rate and rhythm. S1,S2 noted.  No murmur, rubs or gallops noted. No JVD or BLE edema.  Pulmonary/Chest: Normal effort and positive vesicular breath sounds. No respiratory distress. No wheezes, rales or ronchi noted.  Abdomen: Normal bowel sounds. Musculoskeletal: Strength 5/5 BUE/BLE. No difficulty with gait.  Neurological: Alert and oriented. Cranial nerves II-XII grossly intact. Coordination normal.  Psychiatric: Mood and affect normal. Behavior is normal. Judgment and thought  content normal.    BMET    Component Value Date/Time   NA 137 06/13/2022 1011   NA 137 01/30/2021 1146   K 3.9 06/13/2022 1011   CL 106 06/13/2022 1011   CO2 25 06/13/2022 1011   GLUCOSE 81 06/13/2022 1011   BUN 13 06/13/2022 1011   BUN 9 01/30/2021 1146   CREATININE 0.84 06/13/2022 1011   CALCIUM 8.9 06/13/2022 1011   GFRNONAA 83 04/08/2018 1018   GFRAA 96 04/08/2018 1018    Lipid Panel     Component Value Date/Time   CHOL 79 06/13/2022 1011   CHOL 99 (L) 01/30/2021 1146   TRIG 63 06/13/2022 1011   HDL 33 (L) 06/13/2022 1011   HDL 36 (L) 01/30/2021 1146   CHOLHDL 2.4 06/13/2022 1011   VLDL 11.4 08/10/2019 1223   LDLCALC 32 06/13/2022 1011    CBC  Component Value Date/Time   WBC 6.7 06/13/2022 1011   RBC 4.31 06/13/2022 1011   HGB 9.7 (L) 06/13/2022 1011   HGB 10.1 (L) 01/30/2021 1147   HCT 31.3 (L) 06/13/2022 1011   HCT 31.9 (L) 01/30/2021 1147   PLT 170 06/13/2022 1011   PLT 254 01/30/2021 1147   MCV 72.6 (L) 06/13/2022 1011   MCV 70 (L) 01/30/2021 1147   MCH 22.5 (L) 06/13/2022 1011   MCHC 31.0 (L) 06/13/2022 1011   RDW 23.3 (H) 06/13/2022 1011   RDW 22.7 (H) 01/30/2021 1147    Hgb A1C Lab Results  Component Value Date   HGBA1C <4.2 (L) 01/30/2021            Assessment & Plan:   Preventative Health Maintenance:  Flu shot declined Tetanus UTD Encouraged her to get her COVID booster Pap smear UTD Mammogram ordered-she will call to schedule Encouraged her to consume a balanced diet and exercise regimen Advised her seeing eye doctor and dentist annually We will check CBC, c-Met, lipid profile and A1c today  RTC in 6 months, follow-up chronic conditions Nicki Reaper, NP

## 2023-04-24 ENCOUNTER — Telehealth: Payer: Self-pay

## 2023-04-24 ENCOUNTER — Encounter: Payer: Self-pay | Admitting: Internal Medicine

## 2023-04-24 LAB — COMPLETE METABOLIC PANEL WITH GFR
AG Ratio: 1.6 (calc) (ref 1.0–2.5)
ALT: 7 U/L (ref 6–29)
AST: 14 U/L (ref 10–35)
Albumin: 4.5 g/dL (ref 3.6–5.1)
Alkaline phosphatase (APISO): 65 U/L (ref 31–125)
BUN: 12 mg/dL (ref 7–25)
CO2: 26 mmol/L (ref 20–32)
Calcium: 9.3 mg/dL (ref 8.6–10.2)
Chloride: 105 mmol/L (ref 98–110)
Creat: 0.67 mg/dL (ref 0.50–0.99)
Globulin: 2.9 g/dL (ref 1.9–3.7)
Glucose, Bld: 78 mg/dL (ref 65–99)
Potassium: 4 mmol/L (ref 3.5–5.3)
Sodium: 136 mmol/L (ref 135–146)
Total Bilirubin: 1.5 mg/dL — ABNORMAL HIGH (ref 0.2–1.2)
Total Protein: 7.4 g/dL (ref 6.1–8.1)
eGFR: 108 mL/min/{1.73_m2} (ref >=60)

## 2023-04-24 LAB — HEMOGLOBIN A1C: Hgb A1c MFr Bld: <4.2 %{Hb} (ref <5.7)

## 2023-04-24 LAB — CBC
HCT: 33.5 % — ABNORMAL LOW (ref 35.0–45.0)
Hemoglobin: 10.2 g/dL — ABNORMAL LOW (ref 11.7–15.5)
MCH: 22.1 pg — ABNORMAL LOW (ref 27.0–33.0)
MCHC: 30.4 g/dL — ABNORMAL LOW (ref 32.0–36.0)
MCV: 72.7 fL — ABNORMAL LOW (ref 80.0–100.0)
Platelets: 190 10*3/uL (ref 140–400)
RBC: 4.61 10*6/uL (ref 3.80–5.10)
RDW: 23.5 % — ABNORMAL HIGH (ref 11.0–15.0)
WBC: 7.7 10*3/uL (ref 3.8–10.8)

## 2023-04-24 LAB — LIPID PANEL
Cholesterol: 87 mg/dL (ref <200)
HDL: 34 mg/dL — ABNORMAL LOW (ref >=50)
LDL Cholesterol (Calc): 40 mg/dL
Non-HDL Cholesterol (Calc): 53 mg/dL (ref <130)
Total CHOL/HDL Ratio: 2.6 (calc) (ref <5.0)
Triglycerides: 53 mg/dL (ref <150)

## 2023-04-24 NOTE — Telephone Encounter (Signed)
 completed

## 2023-04-24 NOTE — Telephone Encounter (Signed)
 Copied from CRM [id number]. Topic: General - Other >> Apr 23, 2023  3:17 PM Gery Pray wrote: Reason for CRM: Patient need a work note for today 02/26 from your appointment. Patient to return to work tomorrow 02/27. Patient would like work note uploaded into her MyChart.

## 2023-04-24 NOTE — Telephone Encounter (Signed)
 Done! Uploaded note to MyChart.

## 2023-04-24 NOTE — Telephone Encounter (Signed)
 Copied from CRM [id number]. Topic: Medical Record Request - Other >> Apr 24, 2023 12:27 PM Ivette P wrote: Reason for CRM: Pt Called in because letter for work was dated for 04/24/2023. Pt was seen in office 04/23/2023 and needs for note to be dated for 04/23/2023. Pt is requesting a callback [phone]

## 2023-04-24 NOTE — Telephone Encounter (Signed)
 Done. Uploaded on MyChart.

## 2023-05-01 ENCOUNTER — Other Ambulatory Visit: Payer: Self-pay | Admitting: Internal Medicine

## 2023-05-02 NOTE — Telephone Encounter (Signed)
 Requested Prescriptions  Pending Prescriptions Disp Refills   EPINEPHRINE 0.3 mg/0.3 mL IJ SOAJ injection [Pharmacy Med Name: EPINEPHRINE 0.3 MG AUTO-INJECT] 2 each 0    Sig: INJECT 0.3 ML INTO THE MUSCLE AS NEEDED FOR ANAPHYLAXIS     Immunology: Antidotes Passed - 05/02/2023 11:54 AM      Passed - Valid encounter within last 12 months    Recent Outpatient Visits           10 months ago Encounter for general adult medical examination w/o abnormal findings   [facility] [place], Salvadore Oxford, NP   1 year ago Influenza A   [facility] Smitty Cords, DO   1 year ago Need for influenza vaccination   [facility] [place], Salvadore Oxford, NP   1 year ago Anxiety and depression   [facility] [place], Salvadore Oxford, NP   2 years ago Bloating   [place] [facility] [place], Salvadore Oxford, [place]

## 2023-05-30 ENCOUNTER — Other Ambulatory Visit (HOSPITAL_COMMUNITY)
Admission: RE | Admit: 2023-05-30 | Discharge: 2023-05-30 | Disposition: A | Discharge status: Home / Self Care | Source: Ambulatory Visit | Attending: Obstetrics & Gynecology | Admitting: Obstetrics & Gynecology

## 2023-05-30 ENCOUNTER — Encounter: Payer: Self-pay | Admitting: Obstetrics & Gynecology

## 2023-05-30 ENCOUNTER — Ambulatory Visit: Admitting: Obstetrics & Gynecology

## 2023-05-30 VITALS — BP 107/68 | HR 87 | Wt 127.0 lb

## 2023-05-30 DIAGNOSIS — Z01419 Encounter for gynecological examination (general) (routine) without abnormal findings: Secondary | ICD-10-CM | POA: Diagnosis not present

## 2023-05-30 NOTE — Progress Notes (Signed)
 GYNECOLOGY ANNUAL PREVENTATIVE CARE ENCOUNTER NOTE  History:     Christine Turner is a 48 y.o. [phone] female here for a routine annual gynecologic exam.  Current complaints: none.  Reports occasionally skipping periods but attributes this to perimenopause.  Noticed some mood symptoms, helped by Atarax. No debilitating symptoms reported.   Denies abnormal vaginal bleeding, discharge, pelvic pain, problems with intercourse or other gynecologic concerns.    Gynecologic History Patient's last menstrual period was 04/29/2023. Contraception: none Last Pap: 04/20/2020. Result was normal with negative HPV Last Mammogram: 05/28/2022.  Result was normal Last Colonoscopy: 10/22/2019.  Result was normal  Obstetric History OB History  Gravida Para Term Preterm AB Living  5 2 2  3 3   SAB IAB Ectopic Multiple Live Births  2 1  1 3     # Outcome Date GA Lbr Len/2nd Weight Sex Type Anes PTL Lv  5 Term 11/07/15 [redacted]w[redacted]d 13:06 / 00:46 6 lb 14 oz (3.118 kg) M VBAC EPI  LIV  4 SAB 08/2014 [redacted]w[redacted]d         3A Term 08/30/13 [redacted]w[redacted]d  4 lb 5 oz (1.956 kg) M CS-LTranv   LIV     Complications: Intraamniotic Infection  3B Term 08/30/13 [redacted]w[redacted]d  4 lb 11 oz (2.126 kg) M CS-LTranv   LIV     Complications: Intraamniotic Infection  2 SAB 09/2012 [redacted]w[redacted]d         1 IAB 1991 [redacted]w[redacted]d           Past Medical History:  Diagnosis Date   Anemia    Thalassemia trait     Past Surgical History:  Procedure Laterality Date   BREAST BIOPSY     CESAREAN SECTION     COLONOSCOPY  at age 48   normal exam per pt   diastasis repair     DILATION AND CURETTAGE OF UTERUS     ENDOMETRIAL BIOPSY  08/30/2021   IUD REMOVAL  08/30/2021    Current Outpatient Medications on File Prior to Visit  Medication Sig Dispense Refill   Coenzyme Q10 (COQ10) 200 MG CAPS Take 2 capsules by mouth.     EPINEPHRINE 0.3 mg/0.3 mL IJ SOAJ injection INJECT 0.3 ML INTO THE MUSCLE AS NEEDED FOR ANAPHYLAXIS 2 each 0   hydrOXYzine (ATARAX) 25 MG tablet Take 1 tablet  (25 mg total) by mouth 2 (two) times daily as needed. 180 tablet 1   NON FORMULARY CBD Gummies     Prenatal Vit-Fe Fumarate-FA (PRENATAL MULTIVITAMIN) TABS tablet Take 1 tablet by mouth daily at 12 noon.     No current facility-administered medications on file prior to visit.    Allergies  Allergen Reactions   Almond (Diagnostic) Anaphylaxis and Swelling   Apple (Diagnostic) Anaphylaxis   Cherry Anaphylaxis   Fish Allergy Anaphylaxis   Fish-Derived Products Anaphylaxis   Peanut (Diagnostic) Anaphylaxis   Penicillins Hives and Swelling    Has patient had a PCN reaction causing immediate rash, facial/tongue/throat swelling, SOB or lightheadedness with hypotension: Unknown Has patient had a PCN reaction causing severe rash involving mucus membranes or skin necrosis: No Has patient had a PCN reaction that required hospitalization No Has patient had a PCN reaction occurring within the last 10 years: No If all of the above answers are "NO", then may proceed with Cephalosporin use.    Carrot [Daucus Carota] Itching and Swelling    Swelling and itching is of the throat.    Social History:  reports that she  has never smoked. She has never used smokeless tobacco. She reports current alcohol use of about 2.0 standard drinks of alcohol per week. She reports current drug use.  Family History  Problem Relation Age of Onset   Hypertension Mother    Diabetes Father    Hypertension Father    Colon cancer Neg Hx    Colon polyps Neg Hx    Esophageal cancer Neg Hx    Rectal cancer Neg Hx    Stomach cancer Neg Hx    Breast cancer Neg Hx     The following portions of the patient's history were reviewed and updated as appropriate: allergies, current medications, past family history, past medical history, past social history, past surgical history and problem list.  Review of Systems Pertinent items noted in HPI and remainder of comprehensive ROS otherwise negative.  Physical Exam:  BP 107/68    Pulse 87   Wt 127 lb (57.6 kg)   LMP 04/29/2023   BMI 21.80 kg/m  CONSTITUTIONAL: Well-developed, well-nourished female in no acute distress.  HENT:  Normocephalic, atraumatic, External right and left ear normal.  EYES: Conjunctivae and EOM are normal. Pupils are equal, round, and reactive to light. No scleral icterus.  NECK: Normal range of motion, supple, no masses observed. SKIN: Skin is warm and dry. No rash noted. Not diaphoretic. No erythema. No pallor. MUSCULOSKELETAL: Normal range of motion. No tenderness.  No cyanosis, clubbing, or edema. NEUROLOGIC: Alert and oriented to person, place, and time. Normal muscle tone coordination.  PSYCHIATRIC: Normal mood and affect. Normal behavior. Normal judgment and thought content. CARDIOVASCULAR: Normal heart rate noted, regular rhythm RESPIRATORY: Clear to auscultation bilaterally. Effort and breath sounds normal, no problems with respiration noted. BREASTS: Symmetric in size. No masses, tenderness, skin changes, nipple drainage, or lymphadenopathy bilaterally. Performed in the presence of a chaperone. ABDOMEN: Soft, no distention noted.  No tenderness, rebound or guarding.  PELVIC: Normal appearing external genitalia and urethral meatus; normal appearing vaginal mucosa and cervix.  No abnormal vaginal discharge noted.  Pap smear obtained.  Normal uterine size, no other palpable masses, no uterine or adnexal tenderness.  Performed in the presence of a chaperone.   Assessment and Plan:     1. Well woman exam with routine gynecological exam (Primary) - Cytology - PAP Will follow up results of pap smear and manage accordingly. Mammogram already scheduled for breast cancer screening. Colon cancer screening is up to date. Referred to www.menopause.org as a resource for information about perimenopause. Routine preventative health maintenance measures emphasized. Please refer to After Visit Summary for other counseling recommendations.       Jaynie Collins, MD, FACOG Obstetrician & Gynecologist, [facility] for [employer], [facility]

## 2023-06-02 ENCOUNTER — Encounter: Payer: Self-pay | Admitting: Obstetrics & Gynecology

## 2023-06-02 ENCOUNTER — Ambulatory Visit
Admission: RE | Admit: 2023-06-02 | Discharge: 2023-06-02 | Disposition: A | Discharge status: Home / Self Care | Source: Ambulatory Visit | Attending: Internal Medicine | Admitting: Internal Medicine

## 2023-06-02 ENCOUNTER — Ambulatory Visit: Payer: Medicaid Other

## 2023-06-02 DIAGNOSIS — Z1231 Encounter for screening mammogram for malignant neoplasm of breast: Secondary | ICD-10-CM

## 2023-06-02 LAB — CYTOLOGY - PAP
Chlamydia: NEGATIVE
Comment: NEGATIVE
Comment: NEGATIVE
Comment: NEGATIVE
Comment: NORMAL
Diagnosis: NEGATIVE
High risk HPV: NEGATIVE
Neisseria Gonorrhea: NEGATIVE
Trichomonas: NEGATIVE

## 2023-07-29 ENCOUNTER — Other Ambulatory Visit: Payer: Self-pay | Admitting: Internal Medicine

## 2023-07-30 NOTE — Telephone Encounter (Signed)
 Requested Prescriptions  Pending Prescriptions Disp Refills   EPINEPHRINE  0.3 mg/0.3 mL IJ SOAJ injection [Pharmacy Med Name: EPINEPHRINE  0.3 MG AUTO-INJECT] 2 each 0    Sig: INJECT 0.3 ML INTO THE MUSCLE AS NEEDED FOR ANAPHYLAXIS     Immunology: Antidotes Passed - 07/30/2023 12:46 PM      Passed - Valid encounter within last 12 months    Recent Outpatient Visits           3 months ago Encounter for screening mammogram for breast cancer   [facility] [place], [place], [place]

## 2023-08-26 ENCOUNTER — Telehealth: Payer: Self-pay

## 2023-08-26 NOTE — Telephone Encounter (Signed)
 Pt calling stating that you and pt discussed HRT at her last appt, Pt requesting it to be a low dose HRT.    Please advise if okay with sending it in.

## 2023-08-28 ENCOUNTER — Encounter: Payer: Self-pay | Admitting: Internal Medicine

## 2023-08-28 NOTE — Telephone Encounter (Signed)
 I would like to schedule a virtual visit to discuss this further.

## 2023-09-09 ENCOUNTER — Telehealth: Admitting: Internal Medicine

## 2023-09-09 ENCOUNTER — Encounter: Payer: Self-pay | Admitting: Internal Medicine

## 2023-09-09 DIAGNOSIS — D561 Beta thalassemia: Secondary | ICD-10-CM | POA: Diagnosis not present

## 2023-09-09 DIAGNOSIS — F5104 Psychophysiologic insomnia: Secondary | ICD-10-CM

## 2023-09-09 DIAGNOSIS — F32A Depression, unspecified: Secondary | ICD-10-CM

## 2023-09-09 DIAGNOSIS — D508 Other iron deficiency anemias: Secondary | ICD-10-CM | POA: Diagnosis not present

## 2023-09-09 DIAGNOSIS — F419 Anxiety disorder, unspecified: Secondary | ICD-10-CM

## 2023-09-09 DIAGNOSIS — N951 Menopausal and female climacteric states: Secondary | ICD-10-CM | POA: Diagnosis not present

## 2023-09-09 MED ORDER — CONJ ESTROG-MEDROXYPROGEST ACE 0.3-1.5 MG PO TABS: 1.0000 | ORAL_TABLET | Freq: Every day | ORAL | 1 refills | Status: DC

## 2023-09-09 NOTE — Assessment & Plan Note (Signed)
 Continue hydroxyzine  25 mg twice daily as needed Support offered

## 2023-09-09 NOTE — Assessment & Plan Note (Signed)
We will monitor CBC yearly ?

## 2023-09-09 NOTE — Assessment & Plan Note (Signed)
 Continue oral iron in the form of prenatal vitamin We will monitor CBC daily

## 2023-09-09 NOTE — Assessment & Plan Note (Signed)
 Will start prempro 0.3-1.5 mg daily

## 2023-09-09 NOTE — Patient Instructions (Signed)
 Perimenopause: What to Know Perimenopause is the time in your life when your levels of estrogen start to go down. Estrogen is the female hormone made by your ovaries. Perimenopause can start 2-8 years before menopause. It can cause changes to your menstrual period. During this time, your ovaries may or may not make an egg. In many cases, you can still get pregnant. What are the causes? Perimenopause is a natural change in your homone levels that happens as you get older. What increases the risk? You're more likely to start perimenopause Girtrude Enslin if: You have an abnormal growth (tumor) of the pituitary gland in your brain. You have a disease that affects your ovaries. You've had certain treatments for cancer. These include: Chemotherapy. Hormone therapy. Radiation therapy on the area between your hips (pelvis). You smoke a lot or drink a lot of alcohol. Other family members have gone through menopause Andrena Margerum. What are the signs or symptoms? Symptoms are unique to each person. You may have: Hot flashes. Irregular periods. Night sweats. Changes in how you feel about sex. You may have less of a sex drive or feel more discomfort around your sexuality. Vaginal dryness. Headaches. Mood swings. Other symptoms may include: Depression. This is when you feel sad or hopeless. Trouble sleeping. Memory problems or trouble focusing. Irritability. This means getting annoyed easily. Tiredness. Weight gain. Anxiety. This is feeling worried or nervous. You can also have trouble getting pregnant. How is this diagnosed? You may be diagnosed based on: Your medical history. An exam. Your age. Your history of menstrual periods. Your symptoms. Hormone tests. How is this treated? In some cases, no treatment is needed. Talk with your health care provider about if you should get treated. Treatments may include: Menopausal hormone therapy (MHT). Medicines to treat certain  symptoms. Acupuncture. Vitamin or herbal supplements. Before you start treatment, let your provider know if you or anyone in your family has or has had: Heart disease. Breast cancer. Blood clots. Diabetes. Osteoporosis. Follow these instructions at home: Eating and drinking  Eat a balanced diet. It should include: Fresh fruits and vegetables. Whole grains. Soybeans. Eggs. Lean meat. Low-fat dairy. To help prevent hot flashes, stay away from: Alcohol. Drinks with caffeine in them. Spicy foods. Lifestyle Do not smoke, vape, or use nicotine or tobacco. Get at least 30 minutes of physical activity on 5 or more days each week. Get 7-8 hours of sleep each night. Dress in layers that can be taken off if you have a hot flash. Find ways to manage stress. You may want to try: Deep breathing. Meditation. Writing in a journal. General instructions  Take your medicines only as told. Keep track of your periods. Track: When they happen. How heavy they are. How long they last. How much time passes between periods. Keep track of your symptoms. Track: When they start. How often you have them. How long they last. Use vaginal lubricants or moisturizers. These can help with: Vaginal dryness. Comfort during sex. You can still get pregnant if you're having any periods. Make sure you use birth control if you don't want to get pregnant. Contact a health care provider if: You have a very heavy period or pass blood clots. Your period lasts more than 2 days longer than normal. Your period comes back sooner than 21 days. You bleed after having sex. You have pain during sex. You have pain when you pee. You get very bad headaches. You have trouble with your eyesight. Get help right away if: You  have chest pain. You have trouble breathing. You have trouble talking. You have very bad depression. This information is not intended to replace advice given to you by your health care provider.  Make sure you discuss any questions you have with your health care provider. Document Revised: 10/17/2022 Document Reviewed: 10/17/2022 Elsevier Patient Education  2024 [employer].

## 2023-09-09 NOTE — Assessment & Plan Note (Signed)
 Stable on hydroxyzine  25 mg twice daily as needed Encouraged her to continue to see her therapist Support offered

## 2023-09-09 NOTE — Progress Notes (Signed)
 Virtual Visit via Video Note  I connected with Christine Turner on 09/09/23 at  1:00 PM EDT by a video enabled telemedicine application and verified that I am speaking with the correct person using two identifiers.  Location: Patient: Home Provider: Office  Person's participating in this video call: Angeline Laura, NP-C and Rosela Supak   I discussed the limitations of evaluation and management by telemedicine and the availability of in person appointments. The patient expressed understanding and agreed to proceed.  History of Present Illness:  Patient due for follow-up of chronic conditions.  Anxiety and depression: Chronic, managed on hydroxyzine  as needed. She is currently seeing a therapist.  She denies SI/HI.   Insomnia: She has difficulty falling asleep.  She takes hydroxyzine  as needed with minimal relief of symptoms.  There is no sleep study on file.   Iron deficiency anemia/beta thalassemia: Her last H/H was 10. 2/33.5, 03/2023.  She is taking an iron supplement as prescribed.  She does not follow with hematology  She is concerned about perimenopausal changes. She is having more trouble sleeping, mood issues and weight gain. She has noticed this over the last 6-7 months. She is interested in HRT.  Past Medical History:  Diagnosis Date   Anemia    Thalassemia trait     Current Outpatient Medications  Medication Sig Dispense Refill   Coenzyme Q10 (COQ10) 200 MG CAPS Take 2 capsules by mouth.     EPINEPHRINE  0.3 mg/0.3 mL IJ SOAJ injection INJECT 0.3 ML INTO THE MUSCLE AS NEEDED FOR ANAPHYLAXIS 2 each 0   hydrOXYzine  (ATARAX ) 25 MG tablet Take 1 tablet (25 mg total) by mouth 2 (two) times daily as needed. 180 tablet 1   NON FORMULARY CBD Gummies     Prenatal Vit-Fe Fumarate-FA (PRENATAL MULTIVITAMIN) TABS tablet Take 1 tablet by mouth daily at 12 noon.     No current facility-administered medications for this visit.    Allergies  Allergen Reactions   Almond  (Diagnostic) Anaphylaxis and Swelling   Apple (Diagnostic) Anaphylaxis   Cherry Anaphylaxis   Fish Allergy Anaphylaxis   Fish-Derived Products Anaphylaxis   Peanut (Diagnostic) Anaphylaxis   Penicillins Hives and Swelling    Has patient had a PCN reaction causing immediate rash, facial/tongue/throat swelling, SOB or lightheadedness with hypotension: Unknown Has patient had a PCN reaction causing severe rash involving mucus membranes or skin necrosis: No Has patient had a PCN reaction that required hospitalization No Has patient had a PCN reaction occurring within the last 10 years: No If all of the above answers are NO, then may proceed with Cephalosporin use.    Carrot [Daucus Carota] Itching and Swelling    Swelling and itching is of the throat.    Family History  Problem Relation Age of Onset   Hypertension Mother    Diabetes Father    Hypertension Father    Colon cancer Neg Hx    Colon polyps Neg Hx    Esophageal cancer Neg Hx    Rectal cancer Neg Hx    Stomach cancer Neg Hx    Breast cancer Neg Hx     Social History   Socioeconomic History   Marital status: Married    Spouse name: Not on file   Number of children: Not on file   Years of education: Not on file   Highest education level: Not on file  Occupational History   Not on file  Tobacco Use   Smoking status: Never   Smokeless  tobacco: Never  Vaping Use   Vaping status: Never Used  Substance and Sexual Activity   Alcohol use: Yes    Alcohol/week: 2.0 standard drinks of alcohol    Types: 2 Glasses of wine per week   Drug use: Yes    Comment: canibis gummies daily per pt   Sexual activity: Yes    Birth control/protection: I.U.D.  Other Topics Concern   Not on file  Social History Narrative   Not on file   Social Drivers of Health   Financial Resource Strain: Not on file  Food Insecurity: No Food Insecurity (04/20/2020)   Hunger Vital Sign    Worried About Running Out of Food in the Last Year:  Never true    Ran Out of Food in the Last Year: Never true  Transportation Needs: No Transportation Needs (04/20/2020)   PRAPARE - Administrator, Civil Service (Medical): No    Lack of Transportation (Non-Medical): No  Physical Activity: Not on file  Stress: Not on file  Social Connections: Not on file  Intimate Partner Violence: Not on file     Constitutional: Patient is weight gain.  Denies fever, malaise, fatigue, headache.  HEENT: Denies eye pain, eye redness, ear pain, ringing in the ears, wax buildup, runny nose, nasal congestion, bloody nose, or sore throat. Respiratory: Denies difficulty breathing, shortness of breath, cough or sputum production.   Cardiovascular: Denies chest pain, chest tightness, palpitations or swelling in the hands or feet.  Gastrointestinal: Denies abdominal pain, bloating, constipation, diarrhea or blood in the stool.  GU: Denies urgency, frequency, pain with urination, burning sensation, blood in urine, odor or discharge. Musculoskeletal: Denies decrease in range of motion, difficulty with gait, muscle pain or joint pain and swelling.  Skin: Denies redness, rashes, lesions or ulcercations.  Neurological: Patient reports insomnia.  Denies dizziness, difficulty with memory, difficulty with speech or problems with balance and coordination.  Psych: Patient has a history of anxiety and depression.  Denies SI/HI.  No other specific complaints in a complete review of systems (except as listed in HPI above).  Observations/Objective:   Wt Readings from Last 3 Encounters:  05/30/23 127 lb (57.6 kg)  04/23/23 128 lb 6.4 oz (58.2 kg)  07/16/22 125 lb (56.7 kg)    General: Appears her stated age, well developed, well nourished in NAD. Pulmonary/Chest: Normal effort. No respiratory distress.  Neurological: Alert and oriented.  Psychiatric: Mood and affect normal. Behavior is normal. Judgment and thought content normal.     BMET    Component  Value Date/Time   NA 136 04/23/2023 1004   NA 137 01/30/2021 1146   K 4.0 04/23/2023 1004   CL 105 04/23/2023 1004   CO2 26 04/23/2023 1004   GLUCOSE 78 04/23/2023 1004   BUN 12 04/23/2023 1004   BUN 9 01/30/2021 1146   CREATININE 0.67 04/23/2023 1004   CALCIUM 9.3 04/23/2023 1004   GFRNONAA 83 04/08/2018 1018   GFRAA 96 04/08/2018 1018    Lipid Panel     Component Value Date/Time   CHOL 87 04/23/2023 1004   CHOL 99 (L) 01/30/2021 1146   TRIG 53 04/23/2023 1004   HDL 34 (L) 04/23/2023 1004   HDL 36 (L) 01/30/2021 1146   CHOLHDL 2.6 04/23/2023 1004   VLDL 11.4 08/10/2019 1223   LDLCALC 40 04/23/2023 1004    CBC    Component Value Date/Time   WBC 7.7 04/23/2023 1004   RBC 4.61 04/23/2023 1004  HGB 10.2 (L) 04/23/2023 1004   HGB 10.1 (L) 01/30/2021 1147   HCT 33.5 (L) 04/23/2023 1004   HCT 31.9 (L) 01/30/2021 1147   PLT 190 04/23/2023 1004   PLT 254 01/30/2021 1147   MCV 72.7 (L) 04/23/2023 1004   MCV 70 (L) 01/30/2021 1147   MCH 22.1 (L) 04/23/2023 1004   MCHC 30.4 (L) 04/23/2023 1004   RDW 23.5 (H) 04/23/2023 1004   RDW 22.7 (H) 01/30/2021 1147    Hgb A1C Lab Results  Component Value Date   HGBA1C <4.2 04/23/2023       Assessment and Plan:  RTC in 7 months for annual exam  Follow Up Instructions:    I discussed the assessment and treatment plan with the patient. The patient was provided an opportunity to ask questions and all were answered. The patient agreed with the plan and demonstrated an understanding of the instructions.   The patient was advised to call back or seek an in-person evaluation if the symptoms worsen or if the condition fails to improve as anticipated.   Angeline Laura, NP

## 2023-09-18 ENCOUNTER — Ambulatory Visit: Admitting: Obstetrics & Gynecology

## 2023-10-22 ENCOUNTER — Encounter: Payer: Self-pay | Admitting: Internal Medicine

## 2023-10-23 NOTE — Telephone Encounter (Signed)
 She will least need a video visit for this so that we can document the reason she needs to see GI and send the office notes along with the referral.

## 2023-11-14 ENCOUNTER — Other Ambulatory Visit: Payer: Self-pay | Admitting: Internal Medicine

## 2023-11-14 NOTE — Telephone Encounter (Signed)
 Requested Prescriptions  Pending Prescriptions Disp Refills   hydrOXYzine  (ATARAX ) 25 MG tablet [Pharmacy Med Name: HYDROXYZINE  HCL 25 MG TABLET] 180 tablet 0    Sig: TAKE 1 TABLET BY MOUTH 2 TIMES DAILY AS NEEDED.     Ear, Nose, and Throat:  Antihistamines 2 Passed - 11/14/2023  3:46 PM      Passed - Cr in normal range and within 360 days    Creat  Date Value Ref Range Status  04/23/2023 0.67 0.50 - 0.99 mg/dL Final         Passed - Valid encounter within last 12 months    Recent Outpatient Visits           2 months ago Perimenopause   [facility] [place], Angeline ORN, NP   6 months ago Encounter for screening mammogram for breast cancer   [facility] [place], Angeline ORN, NP

## 2024-02-11 ENCOUNTER — Other Ambulatory Visit: Payer: Self-pay | Admitting: Internal Medicine

## 2024-06-09 ENCOUNTER — Ambulatory Visit: Admitting: Obstetrics & Gynecology
